# Patient Record
Sex: Female | Born: 1970
Health system: Southern US, Community
[De-identification: ages and names within clinical notes are randomized; demographics above are authoritative.]

## PROBLEM LIST (undated history)

## (undated) DIAGNOSIS — Z87442 Personal history of urinary calculi: Secondary | ICD-10-CM

## (undated) DIAGNOSIS — I10 Essential (primary) hypertension: Secondary | ICD-10-CM

## (undated) DIAGNOSIS — J45909 Unspecified asthma, uncomplicated: Secondary | ICD-10-CM

## (undated) DIAGNOSIS — L309 Dermatitis, unspecified: Secondary | ICD-10-CM

## (undated) DIAGNOSIS — T7840XA Allergy, unspecified, initial encounter: Secondary | ICD-10-CM

## (undated) DIAGNOSIS — N189 Chronic kidney disease, unspecified: Secondary | ICD-10-CM

## (undated) DIAGNOSIS — E119 Type 2 diabetes mellitus without complications: Secondary | ICD-10-CM

## (undated) DIAGNOSIS — R51 Headache: Secondary | ICD-10-CM

## (undated) DIAGNOSIS — Z9889 Other specified postprocedural states: Secondary | ICD-10-CM

## (undated) DIAGNOSIS — R112 Nausea with vomiting, unspecified: Secondary | ICD-10-CM

## (undated) HISTORY — DX: Type 2 diabetes mellitus without complications: E11.9

## (undated) HISTORY — DX: Allergy, unspecified, initial encounter: T78.40XA

## (undated) HISTORY — PX: HERNIA REPAIR: SHX51

## (undated) HISTORY — DX: Unspecified asthma, uncomplicated: J45.909

## (undated) HISTORY — DX: Dermatitis, unspecified: L30.9

## (undated) HISTORY — PX: ABDOMINAL HYSTERECTOMY: SHX81

## (undated) HISTORY — PX: LIPOMA EXCISION: SHX5283

---

## 1998-04-06 ENCOUNTER — Other Ambulatory Visit: Admission: RE | Admit: 1998-04-06 | Discharge: 1998-04-06 | Payer: Self-pay | Admitting: Obstetrics and Gynecology

## 1999-05-17 ENCOUNTER — Other Ambulatory Visit: Admission: RE | Admit: 1999-05-17 | Discharge: 1999-05-17 | Payer: Self-pay | Admitting: *Deleted

## 1999-06-30 ENCOUNTER — Encounter: Payer: Self-pay | Admitting: Obstetrics & Gynecology

## 1999-06-30 ENCOUNTER — Ambulatory Visit (HOSPITAL_COMMUNITY): Admission: RE | Admit: 1999-06-30 | Discharge: 1999-06-30 | Payer: Self-pay | Admitting: Obstetrics & Gynecology

## 1999-12-08 ENCOUNTER — Inpatient Hospital Stay (HOSPITAL_COMMUNITY): Admission: AD | Admit: 1999-12-08 | Discharge: 1999-12-12 | Payer: Self-pay | Admitting: Obstetrics and Gynecology

## 1999-12-08 ENCOUNTER — Encounter (INDEPENDENT_AMBULATORY_CARE_PROVIDER_SITE_OTHER): Payer: Self-pay | Admitting: Specialist

## 2000-09-19 ENCOUNTER — Encounter: Admission: RE | Admit: 2000-09-19 | Discharge: 2000-09-19 | Payer: Self-pay | Admitting: Surgery

## 2000-09-19 ENCOUNTER — Encounter: Payer: Self-pay | Admitting: Surgery

## 2000-09-26 ENCOUNTER — Other Ambulatory Visit: Admission: RE | Admit: 2000-09-26 | Discharge: 2000-09-26 | Payer: Self-pay | Admitting: Obstetrics and Gynecology

## 2000-10-30 ENCOUNTER — Observation Stay (HOSPITAL_COMMUNITY): Admission: RE | Admit: 2000-10-30 | Discharge: 2000-10-31 | Payer: Self-pay | Admitting: Surgery

## 2000-10-30 ENCOUNTER — Encounter: Payer: Self-pay | Admitting: Surgery

## 2000-10-30 ENCOUNTER — Encounter (INDEPENDENT_AMBULATORY_CARE_PROVIDER_SITE_OTHER): Payer: Self-pay | Admitting: Specialist

## 2001-11-07 ENCOUNTER — Other Ambulatory Visit: Admission: RE | Admit: 2001-11-07 | Discharge: 2001-11-07 | Payer: Self-pay | Admitting: Obstetrics and Gynecology

## 2002-11-11 ENCOUNTER — Other Ambulatory Visit: Admission: RE | Admit: 2002-11-11 | Discharge: 2002-11-11 | Payer: Self-pay | Admitting: Obstetrics and Gynecology

## 2003-02-22 HISTORY — PX: ENDOMETRIAL ABLATION: SHX621

## 2003-06-14 ENCOUNTER — Inpatient Hospital Stay (HOSPITAL_COMMUNITY): Admission: AD | Admit: 2003-06-14 | Discharge: 2003-06-14 | Payer: Self-pay | Admitting: Obstetrics and Gynecology

## 2003-10-06 ENCOUNTER — Ambulatory Visit (HOSPITAL_COMMUNITY): Admission: RE | Admit: 2003-10-06 | Discharge: 2003-10-06 | Payer: Self-pay | Admitting: Obstetrics and Gynecology

## 2003-10-06 ENCOUNTER — Ambulatory Visit (HOSPITAL_BASED_OUTPATIENT_CLINIC_OR_DEPARTMENT_OTHER): Admission: RE | Admit: 2003-10-06 | Discharge: 2003-10-06 | Payer: Self-pay | Admitting: Obstetrics and Gynecology

## 2004-05-11 ENCOUNTER — Emergency Department (HOSPITAL_COMMUNITY): Admission: EM | Admit: 2004-05-11 | Discharge: 2004-05-11 | Payer: Self-pay | Admitting: Emergency Medicine

## 2004-06-06 ENCOUNTER — Emergency Department (HOSPITAL_COMMUNITY): Admission: EM | Admit: 2004-06-06 | Discharge: 2004-06-06 | Payer: Self-pay | Admitting: Emergency Medicine

## 2004-11-11 ENCOUNTER — Other Ambulatory Visit: Admission: RE | Admit: 2004-11-11 | Discharge: 2004-11-11 | Payer: Self-pay | Admitting: Obstetrics and Gynecology

## 2005-12-10 ENCOUNTER — Emergency Department (HOSPITAL_COMMUNITY): Admission: EM | Admit: 2005-12-10 | Discharge: 2005-12-10 | Payer: Self-pay | Admitting: Emergency Medicine

## 2005-12-16 ENCOUNTER — Encounter: Admission: RE | Admit: 2005-12-16 | Discharge: 2005-12-16 | Payer: Self-pay | Admitting: General Surgery

## 2006-01-07 ENCOUNTER — Emergency Department (HOSPITAL_COMMUNITY): Admission: EM | Admit: 2006-01-07 | Discharge: 2006-01-07 | Payer: Self-pay | Admitting: Emergency Medicine

## 2007-08-29 ENCOUNTER — Emergency Department (HOSPITAL_COMMUNITY): Admission: EM | Admit: 2007-08-29 | Discharge: 2007-08-29 | Payer: Self-pay | Admitting: Emergency Medicine

## 2007-12-17 ENCOUNTER — Other Ambulatory Visit: Admission: RE | Admit: 2007-12-17 | Discharge: 2007-12-17 | Payer: Self-pay | Admitting: Obstetrics and Gynecology

## 2009-06-11 ENCOUNTER — Emergency Department (HOSPITAL_COMMUNITY): Admission: EM | Admit: 2009-06-11 | Discharge: 2009-06-11 | Payer: Self-pay | Admitting: Emergency Medicine

## 2010-07-09 NOTE — Op Note (Signed)
NAME:  Misty Barnes, Misty Barnes                         ACCOUNT NO.:  [id number]   MEDICAL RECORD NO.:  [id number]                   PATIENT TYPE:  AMB   LOCATION:  [facility]                                 FACILITY:  [facility]   PHYSICIAN:  Cynthia P. Romine, M.D.             DATE OF BIRTH:  Sep 29, 1970   DATE OF PROCEDURE:  10/06/2003  DATE OF DISCHARGE:                                 OPERATIVE REPORT   PREOPERATIVE DIAGNOSIS:  Menorrhagia.   POSTOPERATIVE DIAGNOSIS:  Menorrhagia.   PROCEDURE:  Endometrial ablation using the Hydrothermablator.   SURGEON:  Cynthia P. Romine, M.D.   ANESTHESIA:  General by LMA.   ESTIMATED BLOOD LOSS:  Minimal.   COMPLICATIONS:  None.   DESCRIPTION OF PROCEDURE:  The patient was taken to the operating room and  after the induction of adequate general anesthesia by LMA, she was placed in  the dorsal lithotomy position and prepped and draped in the usual fashion.  The cervix was grasped on its anterior lip with a single-toothed tenaculum,  and the uterus sounded to 8 cm.  The cervix was dilated to a #23 Shawnie Pons.  The  hysteroscope was introduced.  Photographic documentation was taken of the  clean endometrial cavity.  The hysteroscope was withdrawn to just above the  internal os, and endometrial ablation was carried out in the usual fashion  using the Hydrothermablator.  There were no complications. There was no  leaking of fluid.  Following the procedure, photographic documentation was  taken of the endometrial cavity.  The scope was withdrawn, the instruments  removed from the vagina, and the procedure was terminated.  The patient  tolerated it well and went in satisfactory condition to postanesthesia  recovery.                                               Cynthia P. Romine, M.D.    CPR/MEDQ  D:  10/06/2003  T:  10/06/2003  Job:  [id number]

## 2010-07-09 NOTE — Op Note (Signed)
[place]  Patient:    Misty Barnes, Misty Barnes Visit Number: [id number] MRN: [id number]          Service Type: DSU Location: DAY Attending Physician:  Andre Lefort Dictated by:   Sandria Bales. Ezzard Standing, M.D. Proc. Date: 10/30/00 Admit Date:  10/30/2000   CC:         Misty Barnes. Cliffton Asters, M.D.  Arvella Merles, M.D.   Operative Report  DATE OF BIRTH:  05-Dec-1970  PREOPERATIVE DIAGNOSES:  Right groin mass, umbilical hernia.  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  PROCEDURE:  Right groin exploration with removal of mass and excision of right groin mesh.  SURGEON:  Sandria Bales. Ezzard Standing, M.D.  FIRST ASSISTANT:  No first assistant.  ANESTHESIA:  General with an LMA, supervised by Dr. Mack Guise.  COMPLICATIONS:  None.  INDICATION FOR PROCEDURE:  [name] is a 40 year old white female who has developed right groin pain and a palpable right groin mass.  She had a bilateral inguinal hernia repair about 70 in [place].  She has also had two C-sections, one in 1995 and one in 2001, which were otherwise unremarkable.  This mass has only shown up over the last two or three months. CT scan raises a question of myxoma versus endometrioma.  Patient now comes for exploration of the right groin with removal of this mass.  Also, she has an umbilical hernia we plan to repair at the same time.  DESCRIPTION OF PROCEDURE:  Patient was placed in a supine position and given a general anesthesia with LMA.  She was given 1 g of vancomycin at the initiation of procedure.  Her abdomen was prepped with Betadine solution and sterilely draped.  A right groin incision was made with sharp dissection and carried down into this mass.  Actually, I marked this mass in the holding area before she went back; I also ultrasounded it before and marked it and  put pictures in her chart of this mass which measured about 1.6 cm in maximum diameter.  The mass palpated at about 3 to 4 cm.  I started excising it but got into suture material and granulomatous material which clearly appeared to be chronically infected with a sinus tract; I therefore went on and excised the entire mass which was part mesh.  I also removed what looked like some Prolene sutures; these were probably 2-0 Prolene sutures.  She also had at least one Ethibond suture which had a chronic grunge around it and this actually may be the source of this infection, which is right over the pubic bone medially.  I ended up having to excise a tract which was about 8 to 9 cm in length.  I could not say that I left a definite inguinal hernia defect, but was concerned about leaving a hernia.  I then tried to close the fascial defect using interrupted #1 Vicryl sutures.  Again, I was very hesitant to put any kind of mesh in there because of what I thought was an infection.  I did irrigate with antibiotics which included a double-antibiotic solution which include polymyxin and bacitracin.  I infiltrated with about 20 cc of 0.25% Marcaine plain and then closed it with interrupted #1 Vicryl suture and the skin with a 5-0 subcuticular Vicryl sutures, painted with tincture of Benzoin and Steri-Strips.  Because of the chronic infection in the  right groin, I was very hesitant to try to repair her umbilical hernia, particularly since it is not her primary complaint that she came to me, so I then terminated the procedure just after excising this mass. Dictated by:   Sandria Bales. Ezzard Standing, M.D. Attending Physician:  Andre Lefort DD:  10/30/00 TD:  10/30/00 Job: [id number]

## 2010-07-09 NOTE — H&P (Signed)
[facility]  Patient:    JASNOOR, TRUSSELL                        MRN: [id number] Adm. Date:  [id number] Attending:  Leonard Schwartz Dictator:   Vance Gather Duplantis, C.N.M.                         History and Physical  HISTORY OF PRESENT ILLNESS:   Ms. Gonterman is a 40 year old married black female, gravida 3, para 1-0-1-1 who presents at [redacted] weeks gestation complaining of contractions every 4 to 6 minutes throughout the night.  She denies any leaking or vaginal bleeding.  She denies any nausea, vomiting, headache, or visual disturbances.  Her pregnancy has been followed at [facility] by the MD service and has been essentially uncomplicated though at risk for a history of previous low-transverse cesarean section secondary to failure to progress in 1995, and she also has a history of mitral valve prolapse and atopic dermatitis.  Her pregnancy has also been diagnosed as group B strep positive.  OB-GYN HISTORY:               She is a gravida 3, para 1-0-1-1 who had a low-transverse cesarean section in August 1995, delivered a female infant who weighed 8 pounds, 12 ounces at [redacted] weeks gestation after 48-hour labor induction.  She reports that she "did not dilate at all."  In August 2000, she had an elective AB with no complications and she reports having history of condylomata treated in 1995, abnormal Pap treated with a LEEP procedure in 1999.  GENERAL MEDICAL HISTORY:      She is allergic to penicillin.  It gives her swelling.  Fish gives her swelling and latex gives her a rash.  She reports having had the usual childhood diseases.  She reports a history of mitral valve prolapse diagnosed as a child and she does take antibiotics when she goes to the dentist.  She reports history of anemia as a teenager, migraines previously, and multiple allergies, latex, penicillin, oatmeal, fish, ragweed, etc.  She also had bilateral inguinal hernia surgery  in 1987, a D&C in 2000, and a C section in 1995.  FAMILY HISTORY:               Significant for grandparents with heart disease, hypertension, paternal aunt with chronic bronchitis, maternal grandmother with diabetes, mother with MS, and genetic history significant only for father of the babys first cousin with two children with Downs syndrome.  SOCIAL HISTORY:               She is married to [employer] who is involved and supportive  They are of the [employer].  They are both employed.  She is employed at [employer] as a Nurse, adult.  LABORATORY DATA:              Her prenatal labs reveal blood type O positive, antibody screen is negative.  Sickle cell trait is negative.  Syphilis is nonreactive.  Rubella is immune.  Hepatitis B surface antigen is negative. Pap is within normal limits.  One-hour glucola was within normal range. Maternal serum alpha-fetoprotein had elevated Downs syndrome risk, but her amniocentesis was a normal female.  PHYSICAL EXAMINATION:  VITAL SIGNS:                  Stable.  She is afebrile.  HEENT:                        Grossly within normal limits.  HEART:                        Regular rhythm and rate.  CHEST:                        Clear.  BREASTS:                      Soft and nontender.  ABDOMEN:                      Gravid with uterine contractions every three to four minutes.  Her fetal heart rate is reassuring.  PELVIC:                       Exam after ambulating is now 1 to 2 cm, 80%, and vertex out of the pelvis.  Vertex is confirmed by a portable bedside ultrasound.  EXTREMITIES:                  Within normal limits.  She has diffuse eczema over her entire body.  ASSESSMENT:                   1. Intrauterine pregnancy at 41 weeks.                               2. Early labor.                               3. History of previous low transverse cesarean                                  section.                                4. Desires trial of labor.                               5. Multiparity.                               6. Desires bilateral tubal ligation for                                  sterilization.                               7. Group B strep positive.  PLAN:                         Admit to labor and delivery for a consult with Dr. Leonard Schwartz who will follow patient. DD:  12/08/99 TD:  12/08/99 Job: [id number]

## 2010-07-09 NOTE — Op Note (Signed)
[facility]  Patient:    Misty Barnes, Misty Barnes                        MRN: [id number] Proc. Date: 12/09/99 Adm. Date:  [id number] Attending:  Leonard Schwartz                           Operative Report  PREOPERATIVE DIAGNOSES:       1. Term intrauterine pregnancy.                               2. Failure to progress in labor.                               3. Prior cesarean section.                               4. Desires sterilization.                               5. Meconium stained amniotic fluid.  POSTOPERATIVE DIAGNOSES:      1. Term intrauterine pregnancy.                               2. Failure to progress in labor.                               3. Prior cesarean section.                               4. Desires sterilization.                               5. Meconium stained amniotic fluid.  OPERATION:                    Repeat low transverse cesarean section and                               bilateral tubal ligation.  SURGEON:                      Janine Limbo, M.D.  ASSISTANT:                    Mack Guise, C.N.M.  ANESTHESIA:                   Epidural anesthesia.  ESTIMATED BLOOD LOSS:         750 cc.  INDICATIONS:                  Misty Barnes is a 40 year old female, gravida 3, para 1-0-1-1, who presents at [redacted] weeks gestation in early labor. The patients membranes were ruptured and she was noted to have meconium stained amniotic fluid.  An amnio-infusion was started.  Pitocin was given.  The patient never changed her cervix beyond 2 to 3 cm in spite  of greater than four or five hours of 200 MVU.  Options for management were discussed.  The risks and benefits of cesarean section with tubal ligation were reviewed including, but not limited to, anesthetic complications, bleeding, infections, possible damage to the surrounding organs, and possible tubal failure (10 to 15/1000).  FINDINGS:                     A 7 pound 15  ounce female (name unknown) was delivered from an occiput transverse presentation.  The Apgars were 8 at one minute and 9 at five minutes.  The uterus, fallopian tubes, and ovaries were normal.  The infant cried upon delivery, therefore, the cords were not visualized.  DESCRIPTION OF PROCEDURE:     The patient was taken to the operating room where it was determined that the epidural she had had for labor would be adequate for cesarean delivery. The patients abdomen was prepped with multiple layers of Betadine and then sterilely draped. A Foley catheter had previously been placed.  A low transverse incision was made in the abdomen and carried sharply through the subcutaneous tissue, the fascia, and the anterior peritoneum.  An incision was made in the lower uterine segment and extended transversely. The fetal head was delivered with the assistance of a Mityvac vacuum extractor. The mouth and nose were suctioned. The DeLee trap was used. The remainder of the infant was delivered.  The infant was handed to the awaiting pediatric team.  Routine cord blood studies were obtained. The placenta was removed. The uterine cavity was cleaned of amniotic fluid and clotted blood.  The uterine incision was closed using a running locking suture of 2-0 Vicryl.  Hemostasis was adequate.  The paracolic gutters were cleaned of amniotic fluid and clotted blood.  The left fallopian tube was identified and followed to its fimbriated end.  A knuckle of tube was made on the left using a free tie and then a tied suture ligature of 0 plain catgut.  The knuckle of tube thus made was excised.  The cut ends of the fallopian tubes were cauterized.  Hemostasis was adequate.  An identical procedure was carried out on the opposite side. Again, hemostasis was adequate.  The anterior peritoneum and the abdominal musculature were reapproximated in the midline using 2-0 Vicryl.  The subcutaneous tissue, the fascia, and the  abdominal musculature were irrigated.  The fascia was closed using a running suture of 0 Vicryl followed by three interrupted sutures of 0 Vicryl.  The subcutaneous layer was irrigated.  A Jackson-Pratt drain was placed in the subcutaneous layer and then sutured to the left lower quadrant through a separate incision. The tissue seemed to be moist even after achieving hemostasis.  The subcutaneous layer was then closed using a running suture of 2-0 Vicryl.  The skin was reapproximated using skin staples.  The Jackson-Pratt drain was sewn into place using 3-0 silk.  Sponge, needle and instrument counts were correct on two occasions.  The estimated blood loss was 750 cc.  The patient tolerated the procedure well.  The patient was taken to the recovery room in stable condition. The infant was taken to the full-term nursery in stable condition. D:  12/09/99 TD:  12/09/99 Job: [id number]

## 2010-08-23 ENCOUNTER — Inpatient Hospital Stay (HOSPITAL_COMMUNITY)
Admission: AD | Admit: 2010-08-23 | Discharge: 2010-08-23 | Disposition: A | Discharge status: Home / Self Care | Payer: 59 | Source: Ambulatory Visit | Attending: Obstetrics and Gynecology | Admitting: Obstetrics and Gynecology

## 2010-08-23 ENCOUNTER — Inpatient Hospital Stay (HOSPITAL_COMMUNITY): Payer: 59

## 2010-08-23 DIAGNOSIS — N938 Other specified abnormal uterine and vaginal bleeding: Secondary | ICD-10-CM

## 2010-08-23 DIAGNOSIS — N949 Unspecified condition associated with female genital organs and menstrual cycle: Secondary | ICD-10-CM

## 2010-08-23 DIAGNOSIS — D259 Leiomyoma of uterus, unspecified: Secondary | ICD-10-CM | POA: Insufficient documentation

## 2010-08-23 DIAGNOSIS — N925 Other specified irregular menstruation: Secondary | ICD-10-CM

## 2010-08-23 LAB — CBC
HCT: 34.4 % — ABNORMAL LOW (ref 36.0–46.0)
Hemoglobin: 11.4 g/dL — ABNORMAL LOW (ref 12.0–15.0)
MCH: 27.9 pg (ref 26.0–34.0)
Platelets: 242 10*3/uL (ref 150–400)

## 2010-08-23 LAB — POCT PREGNANCY, URINE: Preg Test, Ur: NEGATIVE

## 2010-08-24 LAB — GC/CHLAMYDIA PROBE AMP, GENITAL: Chlamydia, DNA Probe: NEGATIVE

## 2010-11-13 ENCOUNTER — Inpatient Hospital Stay (INDEPENDENT_AMBULATORY_CARE_PROVIDER_SITE_OTHER)
Admission: RE | Admit: 2010-11-13 | Discharge: 2010-11-13 | Disposition: A | Discharge status: Home / Self Care | Payer: 59 | Source: Ambulatory Visit | Attending: Family Medicine | Admitting: Family Medicine

## 2010-11-13 DIAGNOSIS — L02419 Cutaneous abscess of limb, unspecified: Secondary | ICD-10-CM

## 2010-11-15 LAB — CULTURE, ROUTINE-ABSCESS

## 2010-11-18 LAB — URINE MICROSCOPIC-ADD ON

## 2010-11-18 LAB — URINALYSIS, ROUTINE W REFLEX MICROSCOPIC
Bilirubin Urine: NEGATIVE
Glucose, UA: NEGATIVE
Ketones, ur: NEGATIVE
Protein, ur: NEGATIVE
Specific Gravity, Urine: 1.026
Urobilinogen, UA: 0.2
pH: 7

## 2010-12-10 ENCOUNTER — Inpatient Hospital Stay (HOSPITAL_COMMUNITY)
Admission: RE | Admit: 2010-12-10 | Discharge: 2010-12-10 | Disposition: A | Discharge status: Home / Self Care | Payer: 59 | Source: Ambulatory Visit | Attending: Emergency Medicine | Admitting: Emergency Medicine

## 2011-01-07 ENCOUNTER — Encounter (HOSPITAL_COMMUNITY): Payer: Self-pay

## 2011-01-07 ENCOUNTER — Encounter (HOSPITAL_COMMUNITY)
Admission: RE | Admit: 2011-01-07 | Discharge: 2011-01-07 | Disposition: A | Discharge status: Home / Self Care | Payer: 59 | Source: Ambulatory Visit | Attending: Obstetrics and Gynecology | Admitting: Obstetrics and Gynecology

## 2011-01-07 HISTORY — DX: Headache: R51

## 2011-01-07 HISTORY — DX: Nausea with vomiting, unspecified: R11.2

## 2011-01-07 HISTORY — DX: Chronic kidney disease, unspecified: N18.9

## 2011-01-07 HISTORY — DX: Other specified postprocedural states: Z98.890

## 2011-01-07 LAB — COMPREHENSIVE METABOLIC PANEL
ALT: 12 U/L (ref 0–35)
AST: 17 U/L (ref 0–37)
Alkaline Phosphatase: 65 U/L (ref 39–117)
BUN: 13 mg/dL (ref 6–23)
Calcium: 9.6 mg/dL (ref 8.4–10.5)
Chloride: 99 mEq/L (ref 96–112)
Creatinine, Ser: 0.79 mg/dL (ref 0.50–1.10)
GFR calc Af Amer: >90 mL/min (ref >90)
Glucose, Bld: 101 mg/dL — ABNORMAL HIGH (ref 70–99)
Potassium: 3.3 mEq/L — ABNORMAL LOW (ref 3.5–5.1)
Sodium: 134 mEq/L — ABNORMAL LOW (ref 135–145)
Total Bilirubin: 0.2 mg/dL — ABNORMAL LOW (ref 0.3–1.2)
Total Protein: 8.7 g/dL — ABNORMAL HIGH (ref 6.0–8.3)

## 2011-01-07 LAB — CBC
HCT: 35.6 % — ABNORMAL LOW (ref 36.0–46.0)
MCH: 28.4 pg (ref 26.0–34.0)
MCV: 85.8 fL (ref 78.0–100.0)

## 2011-01-07 LAB — SURGICAL PCR SCREEN: MRSA, PCR: POSITIVE — AB

## 2011-01-07 NOTE — Patient Instructions (Addendum)
YOUR PROCEDURE IS SCHEDULED ON: 01/10/11  ENTER THROUGH THE MAIN ENTRANCE OF [facility] AT:6am  USE DESK PHONE AND DIAL [id number] TO INFORM us OF YOUR ARRIVAL  CALL [phone] IF YOU HAVE ANY QUESTIONS OR PROBLEMS PRIOR TO YOUR ARRIVAL.  REMEMBER: DO NOT EAT OR DRINK AFTER MIDNIGHT :Sunday  SPECIAL INSTRUCTIONS:use Hibiclens   YOU MAY BRUSH YOUR TEETH THE MORNING OF SURGERY   TAKE THESE MEDICINES THE DAY OF SURGERY WITH SIP OF WATER: none   DO NOT WEAR JEWELRY, EYE MAKEUP, LIPSTICK OR DARK FINGERNAIL POLISH DO NOT WEAR LOTIONS OR DEODORANT DO NOT SHAVE FOR 48 HOURS PRIOR TO SURGERY  YOU WILL NOT BE ALLOWED TO DRIVE YOURSELF HOME.  NAME OF DRIVER:[place]- [phone] or [phone]

## 2011-01-07 NOTE — H&P (Signed)
Misty Barnes is an 40 y.o. female.   Chief Complain:abnormal vaginal bleeding HPI:40 yo [name] G3P2 with menorrhagia, and a PUS that showed her uterus is 10 x 8 x 7 cm with 9 fibrois, the largest being 3.5 cm.  Endo bx benign.  No past medical history on file.  No past surgical history on file.  No family history on file. Social History:  does not have a smoking history on file. She does not have any smokeless tobacco history on file. Her alcohol and drug histories not on file.  Allergies: Allergies not on file  No current facility-administered medications on file as of .   No current outpatient prescriptions on file as of .    No results found for this or any previous visit (from the past 48 hour(s)). No results found.  Review of Systems  All other systems reviewed and are negative.    There were no vitals taken for this visit. Physical Exam  Constitutional: She is oriented to person, place, and time. She appears well-developed and well-nourished.  HENT:  Head: Normocephalic and atraumatic.  Eyes: Conjunctivae are normal. Pupils are equal, round, and reactive to light.  Neck: Normal range of motion. Neck supple.  Cardiovascular: Normal rate, regular rhythm and normal heart sounds.   Respiratory: Effort normal and breath sounds normal.  GI: Soft. Bowel sounds are normal.  Genitourinary: Vagina normal.       Uterus 10 cm, mobile, NT.  Adnexa neg.  Musculoskeletal: Normal range of motion.  Neurological: She is oriented to person, place, and time.  Skin: Skin is warm and dry.  Psychiatric: She has a normal mood and affect.     Assessment/Plan Menorrhagia, Fibroids, probabe adenomyosis, for robotic TLH.  Misty Barnes P 01/07/2011, 11:11 AM   I have examined the patient and there are no changes.

## 2011-01-07 NOTE — Pre-Procedure Instructions (Signed)
No open wounds currently but pt had MRSA infection rt lower leg in October.

## 2011-01-09 MED ORDER — CIPROFLOXACIN IN D5W 400 MG/200ML IV SOLN
400.0000 mg | INTRAVENOUS | Status: DC
  Filled 2011-01-09: qty 200

## 2011-01-09 MED ORDER — METRONIDAZOLE IN NACL 5-0.79 MG/ML-% IV SOLN
500.0000 mg | INTRAVENOUS | Status: DC
  Filled 2011-01-09: qty 100

## 2011-01-10 ENCOUNTER — Encounter (HOSPITAL_COMMUNITY): Payer: Self-pay | Admitting: Anesthesiology

## 2011-01-10 ENCOUNTER — Other Ambulatory Visit: Payer: Self-pay | Admitting: Obstetrics and Gynecology

## 2011-01-10 ENCOUNTER — Ambulatory Visit (HOSPITAL_COMMUNITY)
Admission: RE | Admit: 2011-01-10 | Discharge: 2011-01-10 | Disposition: A | Discharge status: Home / Self Care | Payer: 59 | Source: Ambulatory Visit | Attending: Obstetrics and Gynecology | Admitting: Obstetrics and Gynecology

## 2011-01-10 ENCOUNTER — Encounter (HOSPITAL_COMMUNITY): Payer: Self-pay | Admitting: *Deleted

## 2011-01-10 ENCOUNTER — Ambulatory Visit (HOSPITAL_COMMUNITY): Payer: 59 | Admitting: Anesthesiology

## 2011-01-10 ENCOUNTER — Encounter (HOSPITAL_COMMUNITY)
Admission: RE | Disposition: A | Discharge status: Home / Self Care | Payer: Self-pay | Source: Ambulatory Visit | Attending: Obstetrics and Gynecology

## 2011-01-10 DIAGNOSIS — D250 Submucous leiomyoma of uterus: Secondary | ICD-10-CM | POA: Insufficient documentation

## 2011-01-10 DIAGNOSIS — Z01818 Encounter for other preprocedural examination: Secondary | ICD-10-CM | POA: Insufficient documentation

## 2011-01-10 DIAGNOSIS — Z01812 Encounter for preprocedural laboratory examination: Secondary | ICD-10-CM | POA: Insufficient documentation

## 2011-01-10 DIAGNOSIS — N8000 Endometriosis of the uterus, unspecified: Secondary | ICD-10-CM | POA: Insufficient documentation

## 2011-01-10 DIAGNOSIS — N920 Excessive and frequent menstruation with regular cycle: Secondary | ICD-10-CM | POA: Insufficient documentation

## 2011-01-10 DIAGNOSIS — D252 Subserosal leiomyoma of uterus: Secondary | ICD-10-CM | POA: Insufficient documentation

## 2011-01-10 DIAGNOSIS — D251 Intramural leiomyoma of uterus: Secondary | ICD-10-CM | POA: Insufficient documentation

## 2011-01-10 HISTORY — PX: CYSTOSCOPY: SHX5120

## 2011-01-10 SURGERY — ROBOTIC ASSISTED TOTAL HYSTERECTOMY
Anesthesia: General | Site: Bladder | Wound class: Clean Contaminated

## 2011-01-10 MED ORDER — GLYCOPYRROLATE 0.2 MG/ML IJ SOLN
INTRAMUSCULAR | Status: DC | PRN
  Administered 2011-01-10: .6 mg via INTRAVENOUS

## 2011-01-10 MED ORDER — METRONIDAZOLE IN NACL 5-0.79 MG/ML-% IV SOLN
INTRAVENOUS | Status: DC | PRN
  Administered 2011-01-10: 500 mg via INTRAVENOUS

## 2011-01-10 MED ORDER — PROPOFOL 10 MG/ML IV EMUL
INTRAVENOUS | Status: DC | PRN
  Administered 2011-01-10 (×2): 20 mg via INTRAVENOUS
  Administered 2011-01-10: 200 mg via INTRAVENOUS
  Administered 2011-01-10: 50 mg via INTRAVENOUS

## 2011-01-10 MED ORDER — SUFENTANIL CITRATE 50 MCG/ML IV SOLN
INTRAVENOUS | Status: AC
  Filled 2011-01-10: qty 1

## 2011-01-10 MED ORDER — ROCURONIUM BROMIDE 50 MG/5ML IV SOLN
INTRAVENOUS | Status: AC
  Filled 2011-01-10: qty 1

## 2011-01-10 MED ORDER — KETOROLAC TROMETHAMINE 30 MG/ML IJ SOLN
INTRAMUSCULAR | Status: AC
  Administered 2011-01-10: 30 mg via INTRAVENOUS
  Filled 2011-01-10: qty 1

## 2011-01-10 MED ORDER — PROPOFOL 10 MG/ML IV EMUL
INTRAVENOUS | Status: AC
  Filled 2011-01-10: qty 40

## 2011-01-10 MED ORDER — ONDANSETRON HCL 4 MG/2ML IJ SOLN
INTRAMUSCULAR | Status: AC
  Filled 2011-01-10: qty 2

## 2011-01-10 MED ORDER — DEXAMETHASONE SODIUM PHOSPHATE 10 MG/ML IJ SOLN
INTRAMUSCULAR | Status: AC
  Filled 2011-01-10: qty 1

## 2011-01-10 MED ORDER — MIDAZOLAM HCL 5 MG/5ML IJ SOLN
INTRAMUSCULAR | Status: DC | PRN
  Administered 2011-01-10: 2 mg via INTRAVENOUS

## 2011-01-10 MED ORDER — OXYCODONE-ACETAMINOPHEN 5-325 MG PO TABS
1.0000 | ORAL_TABLET | ORAL | Status: DC | PRN
  Administered 2011-01-10: 2 via ORAL
  Administered 2011-01-10: 1 via ORAL
  Filled 2011-01-10: qty 1

## 2011-01-10 MED ORDER — CIPROFLOXACIN IN D5W 400 MG/200ML IV SOLN
INTRAVENOUS | Status: DC | PRN
  Administered 2011-01-10: 400 mg via INTRAVENOUS

## 2011-01-10 MED ORDER — HYDROMORPHONE HCL PF 1 MG/ML IJ SOLN
0.2500 mg | INTRAMUSCULAR | Status: DC | PRN
  Administered 2011-01-10 (×2): 0.5 mg via INTRAVENOUS

## 2011-01-10 MED ORDER — GLYCOPYRROLATE 0.2 MG/ML IJ SOLN
INTRAMUSCULAR | Status: AC
  Filled 2011-01-10: qty 2

## 2011-01-10 MED ORDER — ACETAMINOPHEN 10 MG/ML IV SOLN
1000.0000 mg | Freq: Once | INTRAVENOUS | Status: AC
  Administered 2011-01-10: 1000 mg via INTRAVENOUS
  Filled 2011-01-10: qty 100

## 2011-01-10 MED ORDER — LIDOCAINE HCL (CARDIAC) 20 MG/ML IV SOLN
INTRAVENOUS | Status: AC
  Filled 2011-01-10: qty 5

## 2011-01-10 MED ORDER — FENTANYL CITRATE 0.05 MG/ML IJ SOLN
INTRAMUSCULAR | Status: AC
  Administered 2011-01-10: 50 ug via INTRAVENOUS
  Filled 2011-01-10: qty 2

## 2011-01-10 MED ORDER — PROMETHAZINE HCL 25 MG/ML IJ SOLN
12.5000 mg | INTRAMUSCULAR | Status: DC | PRN
  Administered 2011-01-10: 12.5 mg via INTRAVENOUS
  Filled 2011-01-10: qty 1

## 2011-01-10 MED ORDER — SUFENTANIL CITRATE 50 MCG/ML IV SOLN
INTRAVENOUS | Status: DC | PRN
  Administered 2011-01-10: 10 ug via INTRAVENOUS
  Administered 2011-01-10: 5 ug via INTRAVENOUS
  Administered 2011-01-10: 15 ug via INTRAVENOUS
  Administered 2011-01-10 (×2): 10 ug via INTRAVENOUS

## 2011-01-10 MED ORDER — FENTANYL CITRATE 0.05 MG/ML IJ SOLN
25.0000 ug | INTRAMUSCULAR | Status: DC | PRN
  Administered 2011-01-10 (×2): 50 ug via INTRAVENOUS

## 2011-01-10 MED ORDER — ROPIVACAINE HCL 5 MG/ML IJ SOLN
INTRAMUSCULAR | Status: DC | PRN
  Administered 2011-01-10: 120 mL via EPIDURAL

## 2011-01-10 MED ORDER — DEXTROSE IN LACTATED RINGERS 5 % IV SOLN
INTRAVENOUS | Status: DC
  Administered 2011-01-10: 15:00:00 via INTRAVENOUS

## 2011-01-10 MED ORDER — STERILE WATER FOR IRRIGATION IR SOLN
Status: DC | PRN
  Administered 2011-01-10: 1000 mL

## 2011-01-10 MED ORDER — HYDROMORPHONE HCL PF 1 MG/ML IJ SOLN
INTRAMUSCULAR | Status: AC
  Administered 2011-01-10: 0.5 mg via INTRAVENOUS
  Filled 2011-01-10: qty 1

## 2011-01-10 MED ORDER — ONDANSETRON HCL 4 MG/2ML IJ SOLN
INTRAMUSCULAR | Status: DC | PRN
  Administered 2011-01-10 (×2): 4 mg via INTRAVENOUS

## 2011-01-10 MED ORDER — OXYCODONE-ACETAMINOPHEN 5-325 MG PO TABS
ORAL_TABLET | ORAL | Status: AC
  Administered 2011-01-10: 2 via ORAL
  Filled 2011-01-10: qty 2

## 2011-01-10 MED ORDER — ROCURONIUM BROMIDE 100 MG/10ML IV SOLN
INTRAVENOUS | Status: DC | PRN
  Administered 2011-01-10: 10 mg via INTRAVENOUS
  Administered 2011-01-10: 50 mg via INTRAVENOUS
  Administered 2011-01-10: 10 mg via INTRAVENOUS

## 2011-01-10 MED ORDER — NEOSTIGMINE METHYLSULFATE 1 MG/ML IJ SOLN
INTRAMUSCULAR | Status: AC
  Filled 2011-01-10: qty 10

## 2011-01-10 MED ORDER — DEXAMETHASONE SODIUM PHOSPHATE 10 MG/ML IJ SOLN
INTRAMUSCULAR | Status: DC | PRN
  Administered 2011-01-10: 10 mg via INTRAVENOUS

## 2011-01-10 MED ORDER — KETOROLAC TROMETHAMINE 30 MG/ML IJ SOLN
15.0000 mg | Freq: Once | INTRAMUSCULAR | Status: AC | PRN
  Administered 2011-01-10: 30 mg via INTRAVENOUS

## 2011-01-10 MED ORDER — LACTATED RINGERS IV SOLN
INTRAVENOUS | Status: DC
  Administered 2011-01-10 (×3): via INTRAVENOUS
  Administered 2011-01-10: 1000 mL via INTRAVENOUS

## 2011-01-10 MED ORDER — NEOSTIGMINE METHYLSULFATE 1 MG/ML IJ SOLN
INTRAMUSCULAR | Status: DC | PRN
  Administered 2011-01-10: 3 mg via INTRAVENOUS

## 2011-01-10 MED ORDER — MIDAZOLAM HCL 2 MG/2ML IJ SOLN
INTRAMUSCULAR | Status: AC
  Filled 2011-01-10: qty 2

## 2011-01-10 MED ORDER — LACTATED RINGERS IR SOLN
Status: DC | PRN
  Administered 2011-01-10: 3000 mL

## 2011-01-10 MED ORDER — LIDOCAINE HCL (CARDIAC) 20 MG/ML IV SOLN
INTRAVENOUS | Status: DC | PRN
  Administered 2011-01-10: 50 mg via INTRAVENOUS

## 2011-01-10 SURGICAL SUPPLY — 74 items
ADH SKN CLS APL DERMABOND .7 (GAUZE/BANDAGES/DRESSINGS) ×8
APL SKNCLS STERI-STRIP NONHPOA (GAUZE/BANDAGES/DRESSINGS) ×2
BAG URINE DRAINAGE (UROLOGICAL SUPPLIES) ×3 IMPLANT
BARRIER ADHS 3X4 INTERCEED (GAUZE/BANDAGES/DRESSINGS) ×3 IMPLANT
BENZOIN TINCTURE PRP APPL 2/3 (GAUZE/BANDAGES/DRESSINGS) ×3 IMPLANT
BLADELESS LONG 8MM (BLADE) IMPLANT
BRR ADH 4X3 ABS CNTRL BYND (GAUZE/BANDAGES/DRESSINGS) ×2
CABLE HIGH FREQUENCY MONO STRZ (ELECTRODE) ×3 IMPLANT
CATH FOLEY 3WAY  5CC 16FR (CATHETERS) ×1
CATH FOLEY 3WAY 5CC 16FR (CATHETERS) ×2 IMPLANT
CONT PATH 16OZ SNAP LID 3702 (MISCELLANEOUS) ×3 IMPLANT
COVER MAYO STAND STRL (DRAPES) ×3 IMPLANT
COVER TABLE BACK 60X90 (DRAPES) ×6 IMPLANT
COVER TIP SHEARS 8 DVNC (MISCELLANEOUS) ×2 IMPLANT
COVER TIP SHEARS 8MM DA VINCI (MISCELLANEOUS) ×2
DECANTER SPIKE VIAL GLASS SM (MISCELLANEOUS) ×4 IMPLANT
DERMABOND ADVANCED (GAUZE/BANDAGES/DRESSINGS) ×4
DERMABOND ADVANCED .7 DNX12 (GAUZE/BANDAGES/DRESSINGS) IMPLANT
DRAPE HUG U DISPOSABLE (DRAPE) ×3 IMPLANT
DRAPE LG THREE QUARTER DISP (DRAPES) ×5 IMPLANT
DRAPE MONITOR DA VINCI (DRAPE) ×3 IMPLANT
DRAPE WARM FLUID 44X44 (DRAPE) ×3 IMPLANT
ELECT REM PT RETURN 9FT ADLT (ELECTROSURGICAL) ×3
ELECTRODE REM PT RTRN 9FT ADLT (ELECTROSURGICAL) ×2 IMPLANT
EVACUATOR SMOKE 8.L (FILTER) ×3 IMPLANT
GAUZE VASELINE 3X9 (GAUZE/BANDAGES/DRESSINGS) ×1 IMPLANT
GLOVE BIO SURGEON STRL SZ 6.5 (GLOVE) ×6 IMPLANT
GLOVE BIOGEL PI IND STRL 7.0 (GLOVE) ×8 IMPLANT
GLOVE BIOGEL PI INDICATOR 7.0 (GLOVE) ×4
GLOVE ECLIPSE 6.5 STRL STRAW (GLOVE) ×8 IMPLANT
GLOVE SKINSENSE NS SZ6.5 (GLOVE) ×6
GLOVE SKINSENSE STRL SZ6.5 (GLOVE) IMPLANT
GOWN PREVENTION PLUS LG XLONG (DISPOSABLE) ×21 IMPLANT
GYRUS RUMI II 2.5CM BLUE (DISPOSABLE)
GYRUS RUMI II 3.5CM BLUE (DISPOSABLE)
GYRUS RUMI II 4.0CM BLUE (DISPOSABLE)
KIT DISP ACCESSORY 4 ARM (KITS) ×3 IMPLANT
NDL INSUFFLATION 14GA 120MM (NEEDLE) ×2 IMPLANT
NEEDLE INSUFFLATION 14GA 120MM (NEEDLE) ×3 IMPLANT
NS IRRIG 1000ML POUR BTL (IV SOLUTION) ×6 IMPLANT
OCCLUDER COLPOPNEUMO (BALLOONS) ×3 IMPLANT
PACK LAVH (CUSTOM PROCEDURE TRAY) ×3 IMPLANT
PAD PREP 24X48 CUFFED NSTRL (MISCELLANEOUS) ×6 IMPLANT
PLUG CATH AND CAP STER (CATHETERS) ×3 IMPLANT
RUMI II 3.0CM BLUE KOH-EFFICIE (DISPOSABLE) IMPLANT
RUMI II GYRUS 2.5CM BLUE (DISPOSABLE) IMPLANT
RUMI II GYRUS 3.5CM BLUE (DISPOSABLE) IMPLANT
RUMI II GYRUS 4.0CM BLUE (DISPOSABLE) IMPLANT
SET CYSTO W/LG BORE CLAMP LF (SET/KITS/TRAYS/PACK) ×3 IMPLANT
SET IRRIG TUBING LAPAROSCOPIC (IRRIGATION / IRRIGATOR) ×3 IMPLANT
SOLUTION ELECTROLUBE (MISCELLANEOUS) ×3 IMPLANT
SPONGE LAP 18X18 X RAY DECT (DISPOSABLE) IMPLANT
STRIP CLOSURE SKIN 1/4X4 (GAUZE/BANDAGES/DRESSINGS) ×3 IMPLANT
SUT VIC AB 0 CT1 27 (SUTURE) ×24
SUT VIC AB 0 CT1 27XBRD ANBCTR (SUTURE) ×2 IMPLANT
SUT VIC AB 0 CT1 27XBRD ANTBC (SUTURE) ×10 IMPLANT
SUT VICRYL 0 UR6 27IN ABS (SUTURE) ×3 IMPLANT
SUT VICRYL RAPIDE 4/0 PS 2 (SUTURE) ×6 IMPLANT
SYR 30ML LL (SYRINGE) ×3 IMPLANT
SYR 50ML LL SCALE MARK (SYRINGE) ×4 IMPLANT
SYSTEM CONVERTIBLE TROCAR (TROCAR) IMPLANT
TIP UTERINE 5.1X6CM LAV DISP (MISCELLANEOUS) IMPLANT
TIP UTERINE 6.7X10CM GRN DISP (MISCELLANEOUS) ×1 IMPLANT
TIP UTERINE 6.7X6CM WHT DISP (MISCELLANEOUS) IMPLANT
TIP UTERINE 6.7X8CM BLUE DISP (MISCELLANEOUS) IMPLANT
TOWEL OR 17X24 6PK STRL BLUE (TOWEL DISPOSABLE) ×9 IMPLANT
TROCAR 12M 150ML BLUNT (TROCAR) ×2 IMPLANT
TROCAR DISP BLADELESS 8 DVNC (TROCAR) ×2 IMPLANT
TROCAR DISP BLADELESS 8MM (TROCAR) ×1
TROCAR XCEL 12X100 BLDLESS (ENDOMECHANICALS) ×3 IMPLANT
TUBING FILTER THERMOFLATOR (ELECTROSURGICAL) ×3 IMPLANT
WARMER LAPAROSCOPE (MISCELLANEOUS) ×3 IMPLANT
WATER STERILE IRR 1000ML POUR (IV SOLUTION) ×3 IMPLANT
WATER STERILE IRR 1000ML UROMA (IV SOLUTION) ×1 IMPLANT

## 2011-01-10 NOTE — Anesthesia Postprocedure Evaluation (Signed)
  Anesthesia Post-op Note  Patient: Misty Barnes  Procedure(s) Performed:  ROBOTIC ASSISTED TOTAL HYSTERECTOMY; CYSTOSCOPY  Patient Location: [facility] Unit  Anesthesia Type: General  Level of Consciousness: awake  Airway and Oxygen Therapy: Patient Spontanous Breathing  Post-op Pain: mild  Post-op Assessment: Post-op Vital signs reviewed  Post-op Vital Signs: Reviewed and stable  Complications: No apparent anesthesia complications

## 2011-01-10 NOTE — Anesthesia Postprocedure Evaluation (Signed)
Anesthesia Post Note  Patient: Misty Barnes  Procedure(s) Performed:  ROBOTIC ASSISTED TOTAL HYSTERECTOMY; CYSTOSCOPY  Anesthesia type: General  Patient location: PACU  Post pain: Pain level controlled  Post assessment: Post-op Vital signs reviewed  Last Vitals:  Filed Vitals:   01/10/11 1215  BP: 103/62  Pulse: 70  Temp:   Resp: 16    Post vital signs: Reviewed  Level of consciousness: sedated  Complications: No apparent anesthesia complications

## 2011-01-10 NOTE — Transfer of Care (Signed)
Immediate Anesthesia Transfer of Care Note  Patient: Misty Barnes  Procedure(s) Performed:  ROBOTIC ASSISTED TOTAL HYSTERECTOMY; CYSTOSCOPY  Patient Location: PACU  Anesthesia Type: General  Level of Consciousness: oriented and sedated  Airway & Oxygen Therapy: Patient Spontanous Breathing and Patient connected to nasal cannula oxygen  Post-op Assessment: Report given to PACU RN and Post -op Vital signs reviewed and stable  Post vital signs: stable  Complications: No apparent anesthesia complications

## 2011-01-10 NOTE — Progress Notes (Signed)
Pt requested information on MRSA.  Provided pt with paperwork and pt education on MRSA.

## 2011-01-10 NOTE — Addendum Note (Signed)
Addendum  created 01/10/11 1605 by Cephus Shelling   Modules edited:Notes Section

## 2011-01-10 NOTE — Op Note (Signed)
Preoperative diagnosis: Menorrhagia and uterine fibroids Postoperative diagnosis: Same path pending Procedure robotic assisted total laparoscopic hysterectomy Surgeon Dr. Aram Beecham Romine Assistant Dr. Leda Quail Anesthesia Gen. endotracheal Estimated blood loss 50 cc Complications none Procedure: The patient was taken to the operating room and after induction of adequate general endotracheal anesthesia was placed in the dorsolithotomy position and prepped and draped in usual fashion.  The patient was wrapped with the beanbag to prevent her slipping on the operating room table.  A posterior weighted and anterior Deaver retractor were placed and the cervix was grasped on its anterior lip with a single-tooth tenaculum.  The uterus sounded to 11 cm.  A 10 cm Rumi manipulator and a small Koh ring were placed without difficulty.  The bladder was drained with a Foley catheter.  Meanwhile Dr. Hyacinth Meeker had created a pneumoperitoneum by making a tiny nick in the umbilicus and inserting the peritoneum inserting a varies needle into the peritoneal space.  Pneumoperitoneum was created with 2 L of CO2.  Her infiltrating the skin with ropivacaine that had been diluted one-to-one the skin was incised with a knife and a 5 mm trocar was inserted.  The laparoscope was inserted through the sleeve to document proper placement.  The abdominal wall was transilluminated and the 2 robotic ports were inserted under direct visualization, after infiltrating the skin with a dilute ropivacaine and incised in the skin with a knife.  The ropivacaine was diluted 60 cc of half percent ropivacaine +60 cc of sterile saline.  A small incision was made just in the superior crease of the umbilicus again after infiltrating with ropivacaine, and the bladed 12 mm trocar was inserted and the peritoneal space under direct visualization.  The 5 mm scope was then withdrawn.  60 cc of the quarter percent ropivacaine were then instilled into the pelvis.   The robot was brought in and talked a monopolar scissor was used to port 1 and the PK gyrus was used through port 2 and these were inserted into the pelvis under direct visualization.  The surgeon then proceeded to the consult.  Inspection of the pelvis revealed there to be an adhesion between some of omentum and the anterior abdominal wall in the lower pelvis in the midline.  This was taken down with cautery without difficulty.  The uterus was enlarged approximately 12 weeks size and multinodular.  The largest fibroid seem to the arising from the fundus and was approximately 4 cm.  There was a large nodule in the corpus of the uterus that was very soft and more consistent in its consistency with an adenomyoma than a fibroid.  There was an approximately 3 cm smooth cyst on the right ovary.  Both ovaries were freely mobile.  Both tubes showed evidence of previous tubal sterilization procedure.  The cyst on the right ovary was drained by pouring a small hole in the cyst with the monopolar scissors and allowing it to drain.  Clear fluid drained from the cyst.  The ureters were identified bilaterally and seen peristalsing.  The procedure began on the patient's right identify in the utero-ovarian ligament cauterizing at the PK gyrus and cutting cautery continued across the mesosalpinx ensuring removal of the proximal end of the 2.  The round ligament was cauterized and then cut.  The anterior and posterior leaves of the broad ligament were taken down sharply.  The bladder was dissected off the cervix using sharp dissection and cautery.  The uterine artery on the patient's right was  identified.  It was cauterized multiple times and then cut.  The procedure then moved to the patient's left side the utero-ovarian ligament was identified cauterized and cut again cautery continued across the mesosalpinx to ensure removal of the proximal portion of the left tube.  The round ligament was cauterized and cut.  The anterior  posterior leafs of the broad ligament were taken down sharply.  Further dissection of the bladder was done to free off the cervix.  The right uterine artery was identified.  It was cauterized multiple times and then cut.  Because the patient has never delivered an infant through the vagina and her vagina was felt to be quite narrow it was felt that it would be beneficial to try and remove one or 2 of the fibroids from the uterus before trying to remove it through the vagina.  The large nodule in the fundus was incised with monopolar cautery.  However it was so soft in consistency that it was felt that there was no distinct fibroid that could be excised and that perhaps the uterus would compress on its own as it was removed through the vagina.  Indeed the uterus was able to be removed through the vagina intact.  Uterine weight in the operating room was 311.1 g.  The robotic instruments were exchanged for a suture driver in port 1 and a Cobra grasper in port 2.  A 9 inch VLock suture was dropped through the camera port and was used to close the vagina.  Excellent hemostasis was noted.  The pelvis was irrigated, and was felt to be hemostatic.  A sheet of Interceed was placed over the vaginal cuff.  An additional 60 cc of the dilute ropivacaine solution was dripped into the pelvis.  The robotic instruments were then removed from the abdomen.  The robot was undocked and taken away.  The robotic scope was replaced again with a 5 mm scope, and the needle from the  V LOC that had been tacked to the lateral pelvic sidewall was removed via straight laparoscopy without difficulty.  Robotic ports were removed under direct visualization.  The scope was withdrawn the 5 mm assistant's port was removed.  Pneumoperitoneum was allowed to escape through the umbilical trocar sleeve.  3 manual deep breaths were given to the patient by the nurse anesthetist while the surgeon pressed on the abdomen to ensure the releasing CO2 from the  abdomen.  The umbilical sleeve was removed.  The fascia at the umbilicus was closed with figure-of-eight suture of 0 Vicryl.  The 2 robotic ports were closed with 3-0 Vicryl Rapide and then Dermabond.  The dermabond was placed over the umbilicus incision which was not closed other than with the fascial suture because the skin reapproxomated again so beautifully on its own.  A 5 mm left upper quadrant port was closed with Dermabond.  The surgeon then proceeded to cystoscopy. The cystoscope was inserted into the urethra after the Foley catheter had been removed.  Indigo carmine had not been injected however urine efflux from the ureteral orifices could be clearly seen.  There was no injury to the bladder.  The scope was withdrawn.  A Graves speculum was inserted into the vagina.  Vagina was inspected and found to be intact.  Approximately 20 cc of the dilute ropivacaine solution were injected into the vaginal cuff for postop pain relief.  Instruments were removed from the vagina, and the procedure was terminated.  The patient tolerated it well and when  satisfactory condition to post anesthesia recovery.  Sponge needle instrument counts were correct.

## 2011-01-10 NOTE — Anesthesia Preprocedure Evaluation (Signed)
Anesthesia Evaluation  Patient identified by MRN, date of birth, ID band Patient awake    Reviewed: Allergy & Precautions, H&P , NPO status , Patient's Chart, lab work & pertinent test results, reviewed documented beta blocker date and time   History of Anesthesia Complications (+) PONV  Airway Mallampati: III TM Distance: >3 FB Neck ROM: full    Dental  (+) Teeth Intact   Pulmonary neg pulmonary ROS,  clear to auscultation  Pulmonary exam normal       Cardiovascular Exercise Tolerance: Good neg cardio ROS regular Normal    Neuro/Psych  Headaches (migraines - currently has a migraine), Negative Psych ROS   GI/Hepatic negative GI ROS, Neg liver ROS,   Endo/Other  Morbid obesity  Renal/GU negative Renal ROS  Genitourinary negative   Musculoskeletal   Abdominal   Peds  Hematology negative hematology ROS (+)   Anesthesia Other Findings   Reproductive/Obstetrics negative OB ROS                           Anesthesia Physical Anesthesia Plan  ASA: II  Anesthesia Plan: General   Post-op Pain Management: MAC Combined w/ Regional for Post-op pain   Induction: Intravenous  Airway Management Planned: Oral ETT  Additional Equipment:   Intra-op Plan:   Post-operative Plan: Extubation in OR  Informed Consent: I have reviewed the patients History and Physical, chart, labs and discussed the procedure including the risks, benefits and alternatives for the proposed anesthesia with the patient or authorized representative who has indicated his/her understanding and acceptance.   Dental Advisory Given  Plan Discussed with: CRNA and Surgeon  Anesthesia Plan Comments: (Discussed ultrasound-guided TAP block in PACU for post-op pain.  Patient understands and wishes to proceed.)        Anesthesia Quick Evaluation

## 2011-01-11 ENCOUNTER — Encounter (HOSPITAL_COMMUNITY): Payer: Self-pay | Admitting: Obstetrics and Gynecology

## 2011-03-20 ENCOUNTER — Emergency Department (HOSPITAL_COMMUNITY)
Admission: EM | Admit: 2011-03-20 | Discharge: 2011-03-20 | Disposition: A | Discharge status: Home / Self Care | Payer: 59 | Source: Home / Self Care | Attending: Emergency Medicine | Admitting: Emergency Medicine

## 2011-03-20 ENCOUNTER — Encounter (HOSPITAL_COMMUNITY): Payer: Self-pay

## 2011-03-20 DIAGNOSIS — B302 Viral pharyngoconjunctivitis: Secondary | ICD-10-CM

## 2011-03-20 HISTORY — DX: Essential (primary) hypertension: I10

## 2011-03-20 MED ORDER — IBUPROFEN 600 MG PO TABS: 600.0000 mg | ORAL_TABLET | Freq: Four times a day (QID) | ORAL | Status: DC | PRN

## 2011-03-20 MED ORDER — LIDOCAINE VISCOUS 2 % MT SOLN: 10.0000 mL | OROMUCOSAL | Status: AC | PRN

## 2011-03-20 MED ORDER — HYDROCODONE-ACETAMINOPHEN 5-325 MG PO TABS: 2.0000 | ORAL_TABLET | ORAL | Status: AC | PRN

## 2011-03-20 MED ORDER — PSEUDOEPHEDRINE-GUAIFENESIN ER 120-1200 MG PO TB12: 1.0000 | ORAL_TABLET | Freq: Two times a day (BID) | ORAL | Status: DC | PRN

## 2011-03-20 MED ORDER — TETRACAINE HCL 0.5 % OP SOLN
OPHTHALMIC | Status: AC
  Filled 2011-03-20: qty 2

## 2011-03-20 MED ORDER — POLYETHYL GLYCOL-PROPYL GLYCOL 0.4-0.3 % OP SOLN: 1.0000 [drp] | Freq: Four times a day (QID) | OPHTHALMIC | Status: DC | PRN

## 2011-03-20 NOTE — ED Notes (Signed)
Pt has fever, earache, sorethroat and eyes red and irritated for one week.

## 2011-03-20 NOTE — ED Provider Notes (Signed)
History     CSN: [id number]  Arrival date & time 03/20/11  1457   First MD Initiated Contact with Patient 03/20/11 1500      Chief Complaint  Patient presents with  . URI    (Consider location/radiation/quality/duration/timing/severity/associated sxs/prior treatment) HPI Comments: Patient with  low-grade fevers Tmax 100.5, rhinorrhea, nasal congestion, sore throat, body aches, ear fullness/ear pain x1 week. Reports bilateral conjunctivitis with exudates starting today. States eyes feel "gritty". Mild photophobia. No headache, visual changes, facial rash. States that multiple coworkers with similar symptoms. Has been taking over-the-counter medications without relief. Patient does not wear glasses or contacts.  ROS as noted in HPI. All other ROS negative.   Patient is a 41 y.o. female presenting with URI. The history is provided by the patient. No language interpreter was used.  URI The primary symptoms include fever. The current episode started 6 to 7 days ago.  The onset of the illness is associated with exposure to sick contacts. Symptoms associated with the illness include congestion and rhinorrhea.    Past Medical History  Diagnosis Date  . PONV (postoperative nausea and vomiting)   . Headache     migraines  . Chronic kidney disease     stones    Past Surgical History  Procedure Date  . Endometrial ablation 2005  . Lipoma excision     cs  . Cesarean section     x 2  . Hernia repair   . Cystoscopy 01/10/2011    Procedure: CYSTOSCOPY;  Surgeon: Edwena Felty Romine;  Location: [facility];  Service: Gynecology;  Laterality: N/A;  . Kidney stone surgery   . Abdominal hysterectomy     History reviewed. No pertinent family history.  History  Substance Use Topics  . Smoking status: Never Smoker   . Smokeless tobacco: Not on file  . Alcohol Use: No    OB History    Grav Para Term Preterm Abortions TAB SAB Ect Mult Living                  Review of Systems    Constitutional: Positive for fever.  HENT: Positive for congestion and rhinorrhea.     Allergies  Penicillins  Home Medications   Current Outpatient Rx  Name Route Sig Dispense Refill  . VITAMIN D (ERGOCALCIFEROL) 50000 UNITS PO CAPS Oral Take 50,000 Units by mouth.    Marland Kitchen ZONISAMIDE 25 MG PO CAPS Oral Take 25 mg by mouth daily.    . ACETAMINOPHEN 500 MG PO TABS Oral Take 500 mg by mouth every 6 (six) hours as needed.      . DULOXETINE HCL 60 MG PO CPEP Oral Take 60 mg by mouth daily.      Marland Kitchen HYDROCODONE-ACETAMINOPHEN 5-325 MG PO TABS Oral Take 2 tablets by mouth every 4 (four) hours as needed for pain. 20 tablet 0  . HYDROXYZINE HCL 25 MG PO TABS Oral Take 25 mg by mouth daily as needed. For itching     . IBUPROFEN 600 MG PO TABS Oral Take 1 tablet (600 mg total) by mouth every 6 (six) hours as needed for pain. 30 tablet 0  . LIDOCAINE VISCOUS 2 % MT SOLN Oral Take 10 mLs by mouth as needed for pain. Swish and spit. Do not swallow. 100 mL 0  . METAXALONE 800 MG PO TABS Oral Take 800 mg by mouth daily as needed. For headache pain     . POLYETHYL GLYCOL-PROPYL GLYCOL 0.4-0.3 % OP SOLN Ophthalmic  Apply 1 drop to eye 4 (four) times daily as needed. 5 mL 0  . PSEUDOEPHEDRINE-GUAIFENESIN ER [phone] MG PO TB12 Oral Take 1 tablet by mouth 2 (two) times daily as needed (congestion). 20 each 0    BP 109/79  Pulse 86  Temp(Src) 98.6 F (37 C) (Oral)  Resp 20  SpO2 99%  LMP 11/07/2010  Physical Exam  Nursing note and vitals reviewed. Constitutional: She is oriented to person, place, and time. She appears well-developed and well-nourished.  HENT:  Head: Normocephalic and atraumatic.  Right Ear: Tympanic membrane and ear canal normal.  Left Ear: Tympanic membrane and ear canal normal.  Nose: Mucosal edema and rhinorrhea present. No epistaxis.  Mouth/Throat: Uvula is midline and mucous membranes are normal. Posterior oropharyngeal erythema present. No oropharyngeal exudate.       Enlarged,  erythematous tonsils. (-) frontal, maxillary sinus tenderness  Eyes: EOM are normal. Pupils are equal, round, and reactive to light. No foreign bodies found. Right eye exhibits discharge. Left eye exhibits discharge. Right conjunctiva is injected. Left conjunctiva is injected.  Slit lamp exam:      The right eye shows fluorescein uptake.       The left eye shows fluorescein uptake.       Visual acuity 20/25 bilaterally. Punctate lesions on bilateral corneas on slit-lamp flourescin exam.  Neck: Normal range of motion. Neck supple.  Cardiovascular: Normal rate, regular rhythm, normal heart sounds and intact distal pulses.   Pulmonary/Chest: Effort normal and breath sounds normal. She has no rales.  Abdominal: Soft. Bowel sounds are normal. There is no tenderness.  Musculoskeletal: Normal range of motion.  Lymphadenopathy:    She has cervical adenopathy.  Neurological: She is alert and oriented to person, place, and time.  Skin: Skin is warm and dry. No rash noted.  Psychiatric: She has a normal mood and affect. Her behavior is normal. Judgment and thought content normal.    ED Course  Procedures (including critical care time)  Labs Reviewed - No data to display No results found.   1. Pharyngoconjunctival fever       MDM    Luiz Blare, MD 03/20/11 2201

## 2011-04-11 ENCOUNTER — Encounter: Payer: 59 | Admitting: Physical Medicine & Rehabilitation

## 2011-05-10 ENCOUNTER — Encounter: Payer: 59 | Attending: Physical Medicine & Rehabilitation | Admitting: Physical Medicine & Rehabilitation

## 2011-05-10 ENCOUNTER — Encounter: Payer: Self-pay | Admitting: Physical Medicine & Rehabilitation

## 2011-05-10 ENCOUNTER — Ambulatory Visit (HOSPITAL_COMMUNITY)
Admission: RE | Admit: 2011-05-10 | Discharge: 2011-05-10 | Disposition: A | Discharge status: Home / Self Care | Payer: 59 | Source: Ambulatory Visit | Attending: Physical Medicine & Rehabilitation | Admitting: Physical Medicine & Rehabilitation

## 2011-05-10 VITALS — BP 131/73 | HR 103 | Resp 18 | Ht 65.5 in | Wt 239.0 lb

## 2011-05-10 DIAGNOSIS — E559 Vitamin D deficiency, unspecified: Secondary | ICD-10-CM | POA: Insufficient documentation

## 2011-05-10 DIAGNOSIS — G43119 Migraine with aura, intractable, without status migrainosus: Secondary | ICD-10-CM

## 2011-05-10 DIAGNOSIS — M5030 Other cervical disc degeneration, unspecified cervical region: Secondary | ICD-10-CM | POA: Insufficient documentation

## 2011-05-10 DIAGNOSIS — E669 Obesity, unspecified: Secondary | ICD-10-CM | POA: Insufficient documentation

## 2011-05-10 DIAGNOSIS — M7918 Myalgia, other site: Secondary | ICD-10-CM

## 2011-05-10 DIAGNOSIS — G43909 Migraine, unspecified, not intractable, without status migrainosus: Secondary | ICD-10-CM | POA: Insufficient documentation

## 2011-05-10 DIAGNOSIS — M542 Cervicalgia: Secondary | ICD-10-CM

## 2011-05-10 DIAGNOSIS — IMO0001 Reserved for inherently not codable concepts without codable children: Secondary | ICD-10-CM

## 2011-05-10 MED ORDER — RIZATRIPTAN BENZOATE 5 MG PO TABS: 5.0000 mg | ORAL_TABLET | ORAL | Status: DC | PRN

## 2011-05-10 MED ORDER — GABAPENTIN 100 MG PO CAPS: 100.0000 mg | ORAL_CAPSULE | Freq: Three times a day (TID) | ORAL | Status: DC

## 2011-05-10 MED ORDER — MELOXICAM 7.5 MG PO TABS: 7.5000 mg | ORAL_TABLET | Freq: Every day | ORAL | Status: AC

## 2011-05-10 NOTE — Progress Notes (Signed)
Subjective:    Patient ID: Misty Barnes, female    DOB: 1970/08/08, 41 y.o.   MRN: [id number]  HPI 36 you african [place] female well known to me who comes into today with signficant headaches.  She recalls having onset of headaches after a fall where she hit her head/neck and was unconsicous for a couple minutes. Since then the headaches have been present originating from the left neck and radiating over the left temporal region into the eye.   The headaches are worst in the am when she awakens.  They are usually a grade 5 initially, but they will intensify during the day the more she's up.  They sometimes get better with rest or ice. Hyrdocodone and flexeril help with severe pain when it happens. She has attempted weight loss to help with symptoms.  She walks regularly.  Warm compresses occasionally help. Caffeine avoidance has helped but she still needs caffeine at night to work her third shift.   She reports a feeling of the left neck being "swollen". She reports light-sensitivity.  She sees "round" spots on the left side when the headache starts to get worse.  She occasional will have the visual aura prior the headache sx.  She has seen Dr. Vela Prose for the last 2 years. She last saw him about 2months ago.  She was placed on zonegran qhs without results so far.  She has had trigger point injections in head and neck which would help for a month or so. No botox injections were done. She was also treated by [facility] for symptoms.    Misty Barnes reports a period of 3 weeks where her hearing was impaired.   Imitrex was unhelpful for rescue but maxalt was beneficial. She is unclear as to why it was stopped.   She tried topamax but it had to be stopped due to a drop in her blood pressure. She hasn't tried vpa, lyrica, neurontin, or tegretol.   Pain Inventory Average Pain 8 Pain Right Now 8 My pain is intermittent and aching  In the last 24 hours, has pain interfered with the following? General  activity 4 Relation with others 4 Enjoyment of life 4 What TIME of day is your pain at its worst? Morning and Daytime Sleep (in general) Fair  Pain is worse with: bending and inactivity Pain improves with: heat/ice, therapy/exercise, pacing activities and medication Relief from Meds: 5  Mobility walk without assistance how many minutes can you walk? 2 hours ability to climb steps?  yes do you drive?  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  no  Physicians involved in your care Primary care        Review of Systems  Constitutional: Positive for unexpected weight change.  HENT: Negative.   Eyes: Negative.   Respiratory: Negative.   Cardiovascular: Negative.   Gastrointestinal: Negative.   Genitourinary: Negative.   Musculoskeletal: Negative.   Skin: Negative.   Neurological: Positive for dizziness.  Hematological: Negative.   Psychiatric/Behavioral: Negative.        Objective:   Physical Exam  Constitutional: She is oriented to person, place, and time. She appears well-developed.  HENT:  Head: Normocephalic.  Eyes: EOM are normal. Pupils are equal, round, and reactive to light.  Neck: Normal range of motion.  Cardiovascular: Normal rate and regular rhythm.   Pulmonary/Chest: Effort normal.  Abdominal: Soft.  Musculoskeletal:  Arms:      SCM non tender. Minimal tenderness on spinous processes.    Right shouder non-tender with RTC or biceps provocation.  Sitting posture is fair.  She has a bit of a head forward position.  Neurological: She is alert and oriented to person, place, and time. She has normal strength and normal reflexes. No cranial nerve deficit or sensory deficit. She displays a negative Romberg sign.       Mild senstivity over the right temporal region. Occiput was non-tender          Assessment & Plan:  1. 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 I suspect that her pain has an arthritic component which is flaring her myofascial symptoms. 2. Chronic migraine headaches with aura driven by #1 3. Obesity. 4. Vitamin D deficiency  Plan: 1. C-spine xrays with flexion extension views to assess for significant structural issues.  2. Potential trial of outpt therapy once xrays are reviewed and deemed stable. 3. D/c zonegran.  Trial of neurontin 100mg  tid starting initially at qhs for migraine prophylaxis 4. meloxicam 15 mg qday for cervical/shoulder girdle pain. 5. Consider TPI's and botox 6. Maxalt for rescue migraine sx 7. See me back in about a month 8. Encouraged exercise and good health hygiene (including abstinence from caffeine 9. Checked vitamin d level today

## 2011-05-10 NOTE — Patient Instructions (Signed)
Take one zonegran for 3 days then stop.  Avoid caffeine

## 2011-05-11 ENCOUNTER — Telehealth: Payer: Self-pay | Admitting: Physical Medicine & Rehabilitation

## 2011-05-11 NOTE — Telephone Encounter (Signed)
We can pursue PT if Misty Barnes would like.  i don't see any major findings on her spine xray

## 2011-05-12 ENCOUNTER — Telehealth: Payer: Self-pay | Admitting: *Deleted

## 2011-05-12 DIAGNOSIS — IMO0001 Reserved for inherently not codable concepts without codable children: Secondary | ICD-10-CM

## 2011-05-12 NOTE — Telephone Encounter (Signed)
C-spine x-rays showed nothing significant, and PT would be safe. States would like to do therapy at [facility]. Will send order over and she should await their call to schedule. Call us back if she doesn't hear from them in 7-10 days.

## 2011-05-13 NOTE — Telephone Encounter (Signed)
Addended by: Faith Rogue T on: 05/13/2011 10:45 AM   Modules accepted: Orders

## 2011-05-13 NOTE — Telephone Encounter (Signed)
Order placed

## 2011-05-14 LAB — VITAMIN D 1,25 DIHYDROXY
Vitamin D2 1, 25 (OH)2: <8 pg/mL
Vitamin D3 1, 25 (OH)2: 94 pg/mL

## 2011-05-15 ENCOUNTER — Telehealth: Payer: Self-pay | Admitting: Physical Medicine & Rehabilitation

## 2011-05-15 NOTE — Telephone Encounter (Signed)
Let her know vitamin d level is in fact high.  She could back down her supplement to every other week

## 2011-05-16 ENCOUNTER — Telehealth: Payer: Self-pay | Admitting: *Deleted

## 2011-05-16 NOTE — Telephone Encounter (Signed)
Advised to decrease Vitamin D supplement to every other week, lab value was high.

## 2011-05-19 ENCOUNTER — Encounter: Payer: Self-pay | Admitting: Physical Medicine & Rehabilitation

## 2011-05-27 ENCOUNTER — Telehealth: Payer: Self-pay | Admitting: *Deleted

## 2011-05-27 NOTE — Telephone Encounter (Signed)
Hasn't heard from PT yet. Checked and referral in system. Call to [facility], do not have the referral. We will resend it. Ms Linville notified by phone w/ apologies for the delay.

## 2011-06-07 ENCOUNTER — Ambulatory Visit: Payer: 59 | Attending: Physical Medicine & Rehabilitation | Admitting: Physical Therapy

## 2011-06-07 DIAGNOSIS — M2560 Stiffness of unspecified joint, not elsewhere classified: Secondary | ICD-10-CM | POA: Insufficient documentation

## 2011-06-07 DIAGNOSIS — M542 Cervicalgia: Secondary | ICD-10-CM | POA: Insufficient documentation

## 2011-06-07 DIAGNOSIS — IMO0001 Reserved for inherently not codable concepts without codable children: Secondary | ICD-10-CM | POA: Insufficient documentation

## 2011-06-07 DIAGNOSIS — M6281 Muscle weakness (generalized): Secondary | ICD-10-CM | POA: Insufficient documentation

## 2011-06-14 ENCOUNTER — Ambulatory Visit: Payer: 59 | Admitting: Physical Therapy

## 2011-06-16 ENCOUNTER — Ambulatory Visit: Payer: 59 | Admitting: Physical Therapy

## 2011-06-20 ENCOUNTER — Ambulatory Visit: Payer: 59 | Admitting: Physical Therapy

## 2011-06-21 ENCOUNTER — Encounter: Payer: Self-pay | Admitting: Physical Medicine & Rehabilitation

## 2011-06-21 ENCOUNTER — Encounter: Payer: 59 | Attending: Physical Medicine & Rehabilitation | Admitting: Physical Medicine & Rehabilitation

## 2011-06-21 VITALS — BP 109/66 | HR 95 | Resp 18 | Ht 65.5 in | Wt 234.2 lb

## 2011-06-21 DIAGNOSIS — M542 Cervicalgia: Secondary | ICD-10-CM

## 2011-06-21 DIAGNOSIS — M7918 Myalgia, other site: Secondary | ICD-10-CM

## 2011-06-21 DIAGNOSIS — E559 Vitamin D deficiency, unspecified: Secondary | ICD-10-CM

## 2011-06-21 DIAGNOSIS — G43119 Migraine with aura, intractable, without status migrainosus: Secondary | ICD-10-CM | POA: Insufficient documentation

## 2011-06-21 DIAGNOSIS — IMO0001 Reserved for inherently not codable concepts without codable children: Secondary | ICD-10-CM | POA: Insufficient documentation

## 2011-06-21 MED ORDER — GABAPENTIN 300 MG PO CAPS: 300.0000 mg | ORAL_CAPSULE | Freq: Three times a day (TID) | ORAL | Status: DC

## 2011-06-21 NOTE — Progress Notes (Signed)
Subjective:    Patient ID: Misty Barnes, female    DOB: Oct 29, 1970, 41 y.o.   MRN: [id number]  HPI  [name] is back regarding her headaches and cerviclalgia. The neurontin has helped her headaches quite a bit. She's only had 6 this past month, and a lot of those she attributes to allergies and the abrupt weather changes. Her maxalt generally helps for her breakthrough migraine headaches. She uses maxalt for her breakthrough headaches up to twice daily.  Her neck pain is almost resolved. She has had great results with PT. She is recognizing some of the irritating factors while she works as well. She continues on the mobic and occasionally will use her flexeril.     Pain Inventory Average Pain 2 Pain Right Now 7 My pain is intermittent and dull  In the last 24 hours, has pain interfered with the following? General activity 3 Relation with others 2 Enjoyment of life 2 What TIME of day is your pain at its worst? morning Sleep (in general) Fair  Pain is worse with: some activites Pain improves with: heat/ice, therapy/exercise and medication Relief from Meds: 8  Mobility walk without assistance how many minutes can you walk? 60 ability to climb steps?  yes do you drive?  yes Do you have any goals in this area?  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  no  Physicians involved in your care Any changes since last visit?  no      Review of Systems  Respiratory:       Upper respiratory infection  Musculoskeletal:       Left shoulder spasm  Neurological: Positive for headaches.  All other systems reviewed and are negative.       Objective:   Physical Exam  Constitutional: She is oriented to person, place, and time. She appears well-developed and well-nourished.  HENT:  Head: Normocephalic and atraumatic.  Eyes: Conjunctivae and EOM are normal. Pupils are equal, round,  and reactive to light.  Neck: Normal range of motion. Neck supple.  Cardiovascular: Normal rate and regular rhythm.   Pulmonary/Chest: Effort normal and breath sounds normal.  Abdominal: Soft. Bowel sounds are normal.  Musculoskeletal:       Good ROM in neck. Painless movement today. Minimal pain in cervical paraspinals, scm, and traps.   Neurological: She is alert and oriented to person, place, and time. She has normal strength. No cranial nerve deficit or sensory deficit.  Psychiatric: She has a normal mood and affect. Her behavior is normal. Judgment and thought content normal.          Assessment & Plan:  1. 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 This has improved greatly with therapy and home maintenance. 2. Chronic migraine headaches with aura improved with neurontin 3. Obesity.  4. Vitamin D deficiency  Plan:  1. Continue outpt PT per PT direction until the end of May. This has made a dramatic difference in her cervicalgia.  I explained to her that she needs to keep up with a home program and practice good posture for the rest of her life essentially. 2. Will increase neurontin to 300mg  TID to see if we can further decrease her headache frequency.  I gave her a titration schedule and instructions on what to do if the dose is too much for her to handle. 3. Continue Vit D supp  twice weekly. We can recheck a level this summer., 4. Recommended no more thand 2 doses of maxalt on a given day. 5. I'll see her back in 3 months. She is quite pleased with her progress.

## 2011-06-21 NOTE — Patient Instructions (Signed)
neurontin  300mg  qhs, 100mg  bid x 5 days, then 300mg  qhs and in am  With 100mg  mid day x 5 days, then 300mg  tid

## 2011-06-22 ENCOUNTER — Encounter: Payer: Self-pay | Admitting: Physical Medicine & Rehabilitation

## 2011-06-22 ENCOUNTER — Encounter: Payer: 59 | Admitting: Physical Therapy

## 2011-06-23 ENCOUNTER — Ambulatory Visit: Payer: 59 | Attending: Physical Medicine & Rehabilitation | Admitting: Physical Therapy

## 2011-06-23 DIAGNOSIS — IMO0001 Reserved for inherently not codable concepts without codable children: Secondary | ICD-10-CM | POA: Insufficient documentation

## 2011-06-23 DIAGNOSIS — M542 Cervicalgia: Secondary | ICD-10-CM | POA: Insufficient documentation

## 2011-06-23 DIAGNOSIS — M6281 Muscle weakness (generalized): Secondary | ICD-10-CM | POA: Insufficient documentation

## 2011-06-23 DIAGNOSIS — M2560 Stiffness of unspecified joint, not elsewhere classified: Secondary | ICD-10-CM | POA: Insufficient documentation

## 2011-06-28 ENCOUNTER — Ambulatory Visit: Payer: 59 | Admitting: Physical Therapy

## 2011-06-30 ENCOUNTER — Ambulatory Visit: Payer: 59 | Admitting: Physical Therapy

## 2011-07-04 ENCOUNTER — Ambulatory Visit: Payer: 59 | Admitting: Physical Therapy

## 2011-07-06 ENCOUNTER — Ambulatory Visit: Payer: 59 | Admitting: Physical Therapy

## 2011-07-11 ENCOUNTER — Ambulatory Visit: Payer: 59 | Admitting: Physical Therapy

## 2011-07-13 ENCOUNTER — Encounter: Payer: 59 | Admitting: Physical Therapy

## 2011-07-14 ENCOUNTER — Encounter: Payer: 59 | Admitting: Rehabilitation

## 2011-07-14 ENCOUNTER — Ambulatory Visit: Payer: 59 | Admitting: Rehabilitative and Restorative Service Providers"

## 2011-07-20 ENCOUNTER — Ambulatory Visit: Payer: 59 | Admitting: Physical Therapy

## 2011-07-25 ENCOUNTER — Encounter: Payer: 59 | Admitting: Physical Therapy

## 2011-07-27 ENCOUNTER — Encounter: Payer: 59 | Admitting: Physical Therapy

## 2011-08-01 ENCOUNTER — Other Ambulatory Visit: Payer: Self-pay | Admitting: Obstetrics and Gynecology

## 2011-08-01 DIAGNOSIS — Z1231 Encounter for screening mammogram for malignant neoplasm of breast: Secondary | ICD-10-CM

## 2011-08-29 ENCOUNTER — Ambulatory Visit (HOSPITAL_COMMUNITY): Payer: 59

## 2011-09-16 ENCOUNTER — Ambulatory Visit (HOSPITAL_COMMUNITY)
Admission: RE | Admit: 2011-09-16 | Discharge: 2011-09-16 | Disposition: A | Discharge status: Home / Self Care | Payer: 59 | Source: Ambulatory Visit | Attending: Obstetrics and Gynecology | Admitting: Obstetrics and Gynecology

## 2011-09-16 DIAGNOSIS — Z1231 Encounter for screening mammogram for malignant neoplasm of breast: Secondary | ICD-10-CM | POA: Insufficient documentation

## 2011-09-20 ENCOUNTER — Encounter: Payer: 59 | Attending: Physical Medicine & Rehabilitation | Admitting: Physical Medicine & Rehabilitation

## 2011-09-20 ENCOUNTER — Other Ambulatory Visit: Payer: Self-pay | Admitting: Obstetrics and Gynecology

## 2011-09-20 ENCOUNTER — Encounter: Payer: Self-pay | Admitting: Physical Medicine & Rehabilitation

## 2011-09-20 VITALS — BP 127/60 | HR 72 | Resp 16 | Ht 65.5 in | Wt 247.0 lb

## 2011-09-20 DIAGNOSIS — E669 Obesity, unspecified: Secondary | ICD-10-CM

## 2011-09-20 DIAGNOSIS — E559 Vitamin D deficiency, unspecified: Secondary | ICD-10-CM

## 2011-09-20 DIAGNOSIS — IMO0001 Reserved for inherently not codable concepts without codable children: Secondary | ICD-10-CM

## 2011-09-20 DIAGNOSIS — G43909 Migraine, unspecified, not intractable, without status migrainosus: Secondary | ICD-10-CM | POA: Insufficient documentation

## 2011-09-20 DIAGNOSIS — M7918 Myalgia, other site: Secondary | ICD-10-CM

## 2011-09-20 DIAGNOSIS — G43119 Migraine with aura, intractable, without status migrainosus: Secondary | ICD-10-CM

## 2011-09-20 DIAGNOSIS — R928 Other abnormal and inconclusive findings on diagnostic imaging of breast: Secondary | ICD-10-CM

## 2011-09-20 DIAGNOSIS — M542 Cervicalgia: Secondary | ICD-10-CM

## 2011-09-20 MED ORDER — RIZATRIPTAN BENZOATE 5 MG PO TABS: 5.0000 mg | ORAL_TABLET | ORAL | Status: DC | PRN

## 2011-09-20 NOTE — Patient Instructions (Signed)
TRY GETTING YOUR HR INTO THE 120 RANGE WHEN YOU EXERCISE.  TRY EXERCISING MORE THAN 3 DAYS PER WEEK (5 DAYS IS OPTIMAL)

## 2011-09-20 NOTE — Progress Notes (Signed)
Subjective:    Patient ID: Misty Barnes, female    DOB: [birth year], 41 y.o.   MRN: [id number]  HPI  Chloris is back regarding her headaches. Her headaches have been under excellent control. She is concerned that she's gaining weight. She has put on about 14 lbs over the last few months. She is exercising but can't lose any weight. She tells me she goes to the gym 3 days a week and generally works out for about 2 hours. She uses the treadmill, stationary bike, and eliptical. Her HR only increases to around 80 or 90 BPM with exercises.  Her headaches have been under great control. She is very pleased with this. She tries to keep up with her ROM exercises as outline by therapy.    Pain Inventory Average Pain 2 Pain Right Now 0 My pain is intermittent and dull  In the last 24 hours, has pain interfered with the following? General activity 0 Relation with others 0 Enjoyment of life 0 What TIME of day is your pain at its worst? morning Sleep (in general) Good  Pain is worse with: inactivity Pain improves with: rest, heat/ice and therapy/exercise Relief from Meds: not taking pain medication  Mobility walk without assistance how many minutes can you walk? 60 ability to climb steps?  yes do you drive?  yes  Function employed # of hrs/week 36-48  Neuro/Psych No problems in this area  Prior Studies Any changes since last visit?  no  Physicians involved in your care Any changes since last visit?  no   Family History  Problem Relation Age of Onset  . Hypertension Father   . Multiple sclerosis Mother    History   Social History  . Marital Status: Married    Spouse Name: N/A    Number of Children: N/A  . Years of Education: N/A   Social History Main Topics  . Smoking status: Never Smoker   . Smokeless tobacco: Never Used  . Alcohol Use: No  . Drug Use: No  . Sexually Active: None   Other Topics Concern  . None   Social History Narrative  . None   Past  Surgical History  Procedure Date  . Endometrial ablation 2005  . Lipoma excision     cs  . Cesarean section     x 2  . Hernia repair   . Cystoscopy 01/10/2011    Procedure: CYSTOSCOPY;  Surgeon: Edwena Felty Romine;  Location: [facility];  Service: Gynecology;  Laterality: N/A;  . Kidney stone surgery   . Abdominal hysterectomy    Past Medical History  Diagnosis Date  . PONV (postoperative nausea and vomiting)   . Headache     migraines  . Chronic kidney disease     stones  . Hypertension    BP 127/60  Pulse 72  Resp 16  Ht 5' 5.5" (1.664 m)  Wt 247 lb (112.038 kg)  BMI 40.48 kg/m2  SpO2 99%  LMP 11/07/2010    Review of Systems  Skin: Positive for rash.  All other systems reviewed and are negative.       Objective:   Physical Exam  Constitutional: She is oriented to person, place, and time. She appears well-developed and well-nourished.  HENT:  Head: Normocephalic and atraumatic.  Eyes: Conjunctivae and EOM are normal. Pupils are equal, round, and reactive to light.  Neck: Normal range of motion. Neck supple.  Cardiovascular: Normal rate and regular rhythm.  Pulmonary/Chest: Effort normal and breath  sounds normal.  Abdominal: Soft. Bowel sounds are normal.  Musculoskeletal:  Good ROM in neck. Painless movement today. Minimal pain in cervical paraspinals, scm, and traps.  Neurological: She is alert and oriented to person, place, and time. She has normal strength. No cranial nerve deficit or sensory deficit.  Psychiatric: She has a normal mood and affect. Her behavior is normal. Judgment and thought content normal.    Assessment & Plan:   1. 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 This has improved greatly with therapy and home maintenance.  2. Chronic migraine headaches with aura improved with neurontin  3. Obesity.  4. Vitamin D deficiency-improved Plan:  1. Continue with home exercise program. Discussed the fact that if she  wants to exercise to help lose weight, that she will need to perform more vigorous activity with her HR in the 120 to 120 range. I advised her to use the pulse guides on the equipment she is using to monitor her HR. She should gradually increase her work out intensity to get into that range. 2. To assist with her weigh, i advised that she decrease her neurontin back to 300mg  qhs. If her headaches should increase, then she should go back to bid. 3. Continue Vit D supp OTC. I refilled her maxalt today #10.  4. Referral was made to [facility] dietary clinic for recs on diet for weight mgt. 5. I'll see her back in 6 months. She is quite pleased with her progress

## 2011-09-26 ENCOUNTER — Ambulatory Visit
Admission: RE | Admit: 2011-09-26 | Discharge: 2011-09-26 | Disposition: A | Discharge status: Home / Self Care | Payer: 59 | Source: Ambulatory Visit | Attending: Obstetrics and Gynecology | Admitting: Obstetrics and Gynecology

## 2011-09-26 DIAGNOSIS — R928 Other abnormal and inconclusive findings on diagnostic imaging of breast: Secondary | ICD-10-CM

## 2011-10-26 ENCOUNTER — Ambulatory Visit (INDEPENDENT_AMBULATORY_CARE_PROVIDER_SITE_OTHER): Payer: Commercial Managed Care - PPO | Admitting: Surgery

## 2011-10-26 ENCOUNTER — Encounter (INDEPENDENT_AMBULATORY_CARE_PROVIDER_SITE_OTHER): Payer: Self-pay | Admitting: Surgery

## 2011-10-26 VITALS — BP 118/74 | HR 84 | Temp 97.2°F | Resp 20 | Ht 65.5 in | Wt 241.6 lb

## 2011-10-26 DIAGNOSIS — N6310 Unspecified lump in the right breast, unspecified quadrant: Secondary | ICD-10-CM

## 2011-10-26 DIAGNOSIS — L732 Hidradenitis suppurativa: Secondary | ICD-10-CM | POA: Insufficient documentation

## 2011-10-26 DIAGNOSIS — N63 Unspecified lump in unspecified breast: Secondary | ICD-10-CM

## 2011-10-26 MED ORDER — DOXYCYCLINE HYCLATE 100 MG PO TABS: 100.0000 mg | ORAL_TABLET | Freq: Two times a day (BID) | ORAL | Status: AC

## 2011-10-26 NOTE — Patient Instructions (Addendum)
We will arrange a needle biopsy of the small mass in the right breast. We will start you on some antibiotic for the hidradenitis of the right armpit area

## 2011-10-26 NOTE — Progress Notes (Signed)
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 After discussion we will arrange for a NCB at the breast center.  The hidreadenitis is minimally active, but since it is painful we will try her on a week of doxy 100BID                 CC:  Chief Complaint  Patient presents with  . Breast Mass    eval rt br mass    HPI:  Misty Barnes is a 41 y.o.  female who presents for evaluation of a right breast mass, and also wishes to discuss her hidradenitis. The breast mass was found on routine mammo and on sono looks like a fibroadenoma. She wishes to discuss alternatives for evaluation and management. She has no symptoms and has a negative family history for breast cancer.  She has been dx with axillary hidradenitis and it flares from time to time. Recently she has had more discomfort in the right area wits some minimal drainage. She has used topical bacitracin at times which has provided only temporary relief.    PMH:  has a past medical history of PONV (postoperative nausea and vomiting); Headache; Chronic kidney disease; and Hypertension.  PSH:   has past surgical history that includes Endometrial ablation (2005); Lipoma excision; Cesarean section; Hernia repair; Cystoscopy (01/10/2011); Kidney stone surgery; and Abdominal hysterectomy.  ALLERGIES:   Allergies  Allergen Reactions  . Penicillins Anaphylaxis    MEDICATIONS: Current outpatient prescriptions:acetaminophen (TYLENOL) 500 MG tablet, Take 500 mg by mouth every 6 (six) hours as needed.  , Disp: , Rfl: ;  cyclobenzaprine (FLEXERIL) 10 MG tablet, Take 10 mg by mouth  2 (two) times daily as needed., Disp: , Rfl: ;  gabapentin (NEURONTIN) 300 MG capsule, Take 1 capsule (300 mg total) by mouth 3 (three) times daily., Disp: 90 capsule, Rfl: 5 hydrOXYzine (ATARAX/VISTARIL) 25 MG tablet, Take 25 mg by mouth daily as needed. For itching , Disp: , Rfl: ;  meloxicam (MOBIC) 7.5 MG tablet, Take 1 tablet (7.5 mg total) by mouth daily., Disp: 30 tablet, Rfl: 0;  Multiple Vitamins-Calcium (ONE-A-DAY WOMENS FORMULA PO), Take 1 tablet by mouth daily., Disp: , Rfl: ;  Polyethyl Glycol-Propyl Glycol (SYSTANE) 0.4-0.3 % SOLN, Apply 1 drop to eye 4 (four) times daily as needed., Disp: 5 mL, Rfl: 0 Pseudoephedrine-Guaifenesin (MUCINEX D) [id number] MG TB12, Take 1 tablet by mouth 2 (two) times daily as needed (congestion)., Disp: 20 each, Rfl: 0;  rizatriptan (MAXALT) 5 MG tablet, Take 1 tablet (5 mg total) by mouth as needed for migraine. May repeat in 2 hours if needed, Disp: 10 tablet, Rfl: 3;  doxycycline (VIBRA-TABS) 100 MG tablet, Take 1 tablet (100 mg total) by mouth 2 (two) times daily., Disp:  14 tablet, Rfl: 2 DISCONTD: DULoxetine (CYMBALTA) 60 MG capsule, Take 60 mg by mouth daily.  , Disp: , Rfl:   ROS: She has filled out our 12 point review of systems and it is negative. EXAM:   Vital signs: BP 118/74  Pulse 84  Temp 97.2 F (36.2 C) (Temporal)  Resp 20  Ht 5' 5.5" (1.664 m)  Wt 241 lb 9.6 oz (109.589 kg)  BMI 39.59 kg/m2  LMP 11/07/2010 Breasts; Symmetric with no dominant mass, not tender , normal Lymphatics: NO axillary or supraclvicular adenopathy. Skin: a few open draining areas in Rioght axilla, all superficial and no frank abscess  DATA REVIEWED:  Mammogram and ultrasound reports are noted    Misty Barnes J 10/26/2011  CC: Misty Barnes, Misty Felty, MD, Pcp Not In System

## 2011-10-26 NOTE — Addendum Note (Signed)
Addended byLiliana Cline on: 10/26/2011 04:03 PM   Modules accepted: Orders

## 2011-11-04 ENCOUNTER — Ambulatory Visit
Admission: RE | Admit: 2011-11-04 | Discharge: 2011-11-04 | Disposition: A | Discharge status: Home / Self Care | Payer: 59 | Source: Ambulatory Visit | Attending: Surgery | Admitting: Surgery

## 2011-11-04 DIAGNOSIS — N6310 Unspecified lump in the right breast, unspecified quadrant: Secondary | ICD-10-CM

## 2011-11-11 ENCOUNTER — Telehealth (INDEPENDENT_AMBULATORY_CARE_PROVIDER_SITE_OTHER): Payer: Self-pay | Admitting: General Surgery

## 2011-11-11 NOTE — Telephone Encounter (Signed)
Called patient to make sure she was contacted about pathology results. She is aware this is a fibroadenoma and will call us if she wants to discuss surgery.

## 2011-12-15 ENCOUNTER — Ambulatory Visit (INDEPENDENT_AMBULATORY_CARE_PROVIDER_SITE_OTHER): Payer: 59 | Admitting: Family Medicine

## 2011-12-15 ENCOUNTER — Encounter: Payer: Self-pay | Admitting: Family Medicine

## 2011-12-15 VITALS — Ht 65.5 in | Wt 238.5 lb

## 2011-12-15 DIAGNOSIS — Z6839 Body mass index (BMI) 39.0-39.9, adult: Secondary | ICD-10-CM | POA: Insufficient documentation

## 2011-12-15 DIAGNOSIS — E669 Obesity, unspecified: Secondary | ICD-10-CM

## 2011-12-15 NOTE — Patient Instructions (Addendum)
-   Eat at least 3 meals and 1-2 snacks per day.  Aim for no more than 5 hours between eating.   4 or 5 AM:  Breakfast (pack a "bag lunch," i.e., sandwich, fruit, yogurt)  10 AM:  Snack (individual portions of nuts/seeds; fruit; yogurt; 1/2 sandwich, apple sauce, cereal [choose one with at least 5 g of fiber/serving])  5 PM:  Lunch   10:30-12:30:   Dinner (pack a "bag lunch," i.e., sandwich, veg's, fruit, yogurt or leftovers from lunch or salad) - Reminder:  In place of bread on some days:  Wraps, tortillas, whole-grain crackers, English muffins, sandwich thins - Obtain twice as many veg's as protein or carbohydrate foods for both lunch and dinner. - Continue exercise pattern.  Great job! - Switch to 1% milk (& eventually to fat-free).

## 2011-12-15 NOTE — Progress Notes (Signed)
Medical Nutrition Therapy:  Appt start time: 1330 end time:  1430.  Assessment:  Primary concerns today: Weight management and better nutrition.  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  She is the single mother of a 12-YO girl.   Usual eating pattern includes 2-3 meals and 1 snack per day; gets breakfast 2-5~X wk.  Usual eating times: B- 7- or 8 AM; Sleeps ~10 or 10:30-3 PM; D- 5 PM; to work ~7 PM; Snk- 12:30 AM; Snk- 4 AM or 7 AM; Walk 7-9 AM.  On days off, more consistently get 3 meals a day.   Usual physical activity includes walking 5-6.5 mi (~90+ min) 5 X wk; goes to [employer] if can't walk that day.    Everyday foods include bread ("my weakness"), baked chx, pasta, broccoli, other veg's, ~48 oz diet drinks/day.  Avoided foods include fish (anaphylactic), peanut (according to allergy skin testing).   24-hr recall: B (7:30 AM)-   BK sausage & cheese croissant, 16 oz o.j.  Snk ( AM)-   water L ( PM)-  16 oz water (felt bad w/ allergies) Snk ( PM)-  32 oz water D (6:30 PM)-  [place] 2 chx soft tacos, water, 1 1/2 c rice  Snk (8:30)-  1 slice lemon cake, water Allergies often affect appetite.    Progress Towards Goal(s):  In progress.   Nutritional Diagnosis:  [place]-3.3 Overweight/obesity As related to poor energy balance.  As evidenced by BMI of 39.    Intervention:  Nutrition education.  Monitoring/Evaluation:  Dietary intake, exercise, and body weight in 1 month(s).

## 2012-01-12 ENCOUNTER — Ambulatory Visit: Payer: 59 | Admitting: Family Medicine

## 2012-03-19 ENCOUNTER — Encounter: Payer: 59 | Attending: Physical Medicine & Rehabilitation | Admitting: Physical Medicine & Rehabilitation

## 2012-03-27 ENCOUNTER — Encounter (INDEPENDENT_AMBULATORY_CARE_PROVIDER_SITE_OTHER): Payer: Self-pay

## 2012-11-15 ENCOUNTER — Telehealth: Payer: Self-pay | Admitting: *Deleted

## 2012-11-15 ENCOUNTER — Encounter: Payer: Self-pay | Admitting: Obstetrics & Gynecology

## 2012-11-15 NOTE — Telephone Encounter (Signed)
11/15/12 T/C to patient Misty Barnes, patient had a U/S guided vacuum assisted Core Bx of Rt. Breast 11/04/11. (Path report showed Fibroadenoma) Has not had her screening mammogram was due 09/15/12. A letter was sent from [facility] 08/2012. Please advise .

## 2012-11-15 NOTE — Telephone Encounter (Signed)
Letter printed. Please mail

## 2012-11-21 NOTE — Telephone Encounter (Signed)
Letter mailed 11/16/12 cm

## 2012-12-04 ENCOUNTER — Encounter (HOSPITAL_COMMUNITY): Payer: Self-pay | Admitting: Emergency Medicine

## 2012-12-04 ENCOUNTER — Emergency Department (HOSPITAL_COMMUNITY)
Admission: EM | Admit: 2012-12-04 | Discharge: 2012-12-04 | Disposition: A | Discharge status: Home / Self Care | Payer: 59 | Source: Home / Self Care | Attending: Family Medicine | Admitting: Family Medicine

## 2012-12-04 DIAGNOSIS — M654 Radial styloid tenosynovitis [de Quervain]: Secondary | ICD-10-CM

## 2012-12-04 DIAGNOSIS — L259 Unspecified contact dermatitis, unspecified cause: Secondary | ICD-10-CM

## 2012-12-04 MED ORDER — PREDNISONE 10 MG PO KIT: PACK | ORAL | Status: DC

## 2012-12-04 NOTE — ED Notes (Signed)
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  No otc meds taken for symptoms.  Sitting up right no signs of acute distress.

## 2012-12-04 NOTE — ED Provider Notes (Signed)
Misty Barnes is a 42 y.o. female who presents to Urgent Care today for bilateral hand swelling with right forearm pain. This started Thursday. 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 Additionally she notes a rash appearing on her hands associated with swelling. It is somewhat itchy. She has not tried any medications yet. She denies any shortness of breath which are chronic swelling. She notes her right forearm pain is worse with hand motion. She denies any other radiating pain weakness or numbness. She denies any injury   Past Medical History  Diagnosis Date  . PONV (postoperative nausea and vomiting)   . Headache(784.0)     migraines  . Chronic kidney disease     stones  . Hypertension    History  Substance Use Topics  . Smoking status: Never Smoker   . Smokeless tobacco: Never Used  . Alcohol Use: No   ROS as above Medications reviewed. No current facility-administered medications for this encounter.   Current Outpatient Prescriptions  Medication Sig Dispense Refill  . acetaminophen (TYLENOL) 500 MG tablet Take 500 mg by mouth every 6 (six) hours as needed.        . cyclobenzaprine (FLEXERIL) 10 MG tablet Take 10 mg by mouth 2 (two) times daily as needed.      . gabapentin (NEURONTIN) 300 MG capsule Take 1 capsule (300 mg total) by mouth 3 (three) times daily.  90 capsule  5  . hydrOXYzine (ATARAX/VISTARIL) 25 MG tablet Take 25 mg by mouth daily as needed. For itching       . loratadine (CLARITIN) 10 MG tablet Take 10 mg by mouth daily.      . Multiple Vitamins-Calcium (ONE-A-DAY WOMENS FORMULA PO) Take 1 tablet by mouth daily.      Bertram Gala Glycol-Propyl Glycol (SYSTANE) 0.4-0.3 % SOLN Apply 1 drop to eye 4 (four) times daily as needed.  5 mL  0  . PredniSONE 10 MG KIT 12 day dose pack po  1 kit  0  . Pseudoephedrine-Guaifenesin (MUCINEX D) [phone] MG TB12 Take 1 tablet by mouth 2 (two) times daily as needed  (congestion).  20 each  0  . rizatriptan (MAXALT) 5 MG tablet Take 1 tablet (5 mg total) by mouth as needed for migraine. May repeat in 2 hours if needed  10 tablet  3  . [DISCONTINUED] DULoxetine (CYMBALTA) 60 MG capsule Take 60 mg by mouth daily.          Exam:  BP 105/69  Pulse 89  Temp(Src) 98 F (36.7 C) (Oral)  Resp 16  SpO2 100%  LMP 11/07/2010 Gen: Well NAD HEENT: EOMI,  MMM Lungs: CTABL Nl WOB Heart: RRR no MRG Exts: Non edematous BL  LE, warm and well perfused.  Bilateral hands: Slightly swollen with vesicular appearing rash on the dorsum of both hands. Right forearm radial sided tenderness. Positive Finkelstein's test. Nontender in the anatomical snuffbox. Hand and wrist motion is otherwise normal  No results found for this or any previous visit (from the past 24 hour(s)). No results found.  Assessment and Plan: 42 y.o. female with likely contact dermatitis. Possible de Quervain's tenosynovitis Plan issue with prednisone Dosepak and followup with sports medicine if not improving. Discussed warning signs or symptoms. Please see discharge instructions. Patient expresses understanding.      Rodolph Bong, MD 12/04/12 1228

## 2013-02-17 ENCOUNTER — Emergency Department (HOSPITAL_COMMUNITY)
Admission: EM | Admit: 2013-02-17 | Discharge: 2013-02-17 | Disposition: A | Discharge status: Home / Self Care | Payer: 59 | Source: Home / Self Care | Attending: Emergency Medicine | Admitting: Emergency Medicine

## 2013-02-17 ENCOUNTER — Encounter (HOSPITAL_COMMUNITY): Payer: Self-pay | Admitting: Emergency Medicine

## 2013-02-17 DIAGNOSIS — L259 Unspecified contact dermatitis, unspecified cause: Secondary | ICD-10-CM

## 2013-02-17 DIAGNOSIS — L309 Dermatitis, unspecified: Secondary | ICD-10-CM

## 2013-02-17 MED ORDER — PREDNISONE 10 MG PO TABS: ORAL_TABLET | ORAL | Status: DC

## 2013-02-17 MED ORDER — OXYCODONE-ACETAMINOPHEN 5-325 MG PO TABS: 2.0000 | ORAL_TABLET | ORAL | Status: DC | PRN

## 2013-02-17 MED ORDER — DEXAMETHASONE SODIUM PHOSPHATE 10 MG/ML IJ SOLN
INTRAMUSCULAR | Status: AC
  Filled 2013-02-17: qty 1

## 2013-02-17 MED ORDER — DEXAMETHASONE SODIUM PHOSPHATE 10 MG/ML IJ SOLN
10.0000 mg | Freq: Once | INTRAMUSCULAR | Status: AC
  Administered 2013-02-17: 10 mg via INTRAMUSCULAR

## 2013-02-17 NOTE — ED Provider Notes (Signed)
Medical screening examination/treatment/procedure(s) were performed by non-physician practitioner and as supervising physician I was immediately available for consultation/collaboration.  Manilla Strieter, M.D.  Manraj Yeo C Aksh Swart, MD 02/17/13 2107 

## 2013-02-17 NOTE — ED Provider Notes (Signed)
CSN: [id number]     Arrival date & time 02/17/13  1524 History   First MD Initiated Contact with Patient 02/17/13 1656     Chief Complaint  Patient presents with  . Rash   (Consider location/radiation/quality/duration/timing/severity/associated sxs/prior Treatment) Patient is a 42 y.o. female presenting with rash. The history is provided by the patient. No language interpreter was used.  Rash Location:  Face Facial rash location:  Face Quality: dryness and itchiness   Severity:  Moderate Timing:  Constant Progression:  Worsening Relieved by:  Nothing Ineffective treatments:  None tried Associated symptoms: abdominal pain     Past Medical History  Diagnosis Date  . PONV (postoperative nausea and vomiting)   . Headache(784.0)     migraines  . Chronic kidney disease     stones  . Hypertension    Past Surgical History  Procedure Laterality Date  . Endometrial ablation  2005  . Lipoma excision      cs  . Cesarean section      x 2  . Hernia repair    . Cystoscopy  01/10/2011    Procedure: CYSTOSCOPY;  Surgeon: Edwena Felty Romine;  Location: [facility];  Service: Gynecology;  Laterality: N/A;  . Kidney stone surgery    . Abdominal hysterectomy     Family History  Problem Relation Age of Onset  . Hypertension Father   . Multiple sclerosis Mother    History  Substance Use Topics  . Smoking status: Never Smoker   . Smokeless tobacco: Never Used  . Alcohol Use: No   OB History   Grav Para Term Preterm Abortions TAB SAB Ect Mult Living                 Review of Systems  Gastrointestinal: Positive for abdominal pain.  Skin: Positive for rash.  All other systems reviewed and are negative.  Pt has a history of eczema, atopic dermatitis.  Pt complains of skin behind ear spliting  Allergies  Penicillins  Home Medications   Current Outpatient Rx  Name  Route  Sig  Dispense  Refill  . acetaminophen (TYLENOL) 500 MG tablet   Oral   Take 500 mg by mouth every 6 (six)  hours as needed.           . cyclobenzaprine (FLEXERIL) 10 MG tablet   Oral   Take 10 mg by mouth 2 (two) times daily as needed.         Marland Kitchen EXPIRED: gabapentin (NEURONTIN) 300 MG capsule   Oral   Take 1 capsule (300 mg total) by mouth 3 (three) times daily.   90 capsule   5   . hydrOXYzine (ATARAX/VISTARIL) 25 MG tablet   Oral   Take 25 mg by mouth daily as needed. For itching          . loratadine (CLARITIN) 10 MG tablet   Oral   Take 10 mg by mouth daily.         . Multiple Vitamins-Calcium (ONE-A-DAY WOMENS FORMULA PO)   Oral   Take 1 tablet by mouth daily.         Marland Kitchen oxyCODONE-acetaminophen (PERCOCET/ROXICET) 5-325 MG per tablet   Oral   Take 2 tablets by mouth every 4 (four) hours as needed for severe pain.   16 tablet   0   . Polyethyl Glycol-Propyl Glycol (SYSTANE) 0.4-0.3 % SOLN   Ophthalmic   Apply 1 drop to eye 4 (four) times daily as needed.  5 mL   0   . predniSONE (DELTASONE) 10 MG tablet      6,6,5,5,4,4,3,3,2,2,1,1 taper   42 tablet   0   . PredniSONE 10 MG KIT      12 day dose pack po   1 kit   0   . Pseudoephedrine-Guaifenesin (MUCINEX D) [phone] MG TB12   Oral   Take 1 tablet by mouth 2 (two) times daily as needed (congestion).   20 each   0   . EXPIRED: rizatriptan (MAXALT) 5 MG tablet   Oral   Take 1 tablet (5 mg total) by mouth as needed for migraine. May repeat in 2 hours if needed   10 tablet   3    BP 111/79  Pulse 84  Temp(Src) 98.6 F (37 C) (Oral)  Resp 14  SpO2 98%  LMP 11/07/2010 Physical Exam  Nursing note and vitals reviewed. Constitutional: She is oriented to person, place, and time. She appears well-developed and well-nourished.  HENT:  Head: Normocephalic.  Eyes: EOM are normal.  Neck: Normal range of motion.  Cardiovascular: Normal rate.   Pulmonary/Chest: Effort normal.  Abdominal: She exhibits no distension.  Musculoskeletal: Normal range of motion.  Neurological: She is alert and oriented to  person, place, and time.  Skin: Rash noted. There is erythema.  Psychiatric: She has a normal mood and affect.    ED Course  Procedures (including critical care time) Labs Review Labs Reviewed - No data to display Imaging Review No results found.  EKG Interpretation    Date/Time:    Ventricular Rate:    PR Interval:    QRS Duration:   QT Interval:    QTC Calculation:   R Axis:     Text Interpretation:              MDM   1. Dermatitis    Decadron IM.   Pt given prednisone taper,   Pt advised to follow up with her MD as scheduled for recheck   Elson Areas, PA-C 02/17/13 1957  Lonia Skinner Beason, [place] 02/17/13 1958

## 2013-02-17 NOTE — ED Notes (Signed)
Pt  Reports     She  Had   A  Flair  Up  Of  Her  Atypical  Dermatitis   With     Pain   And  Rash     On  Face  And  Upper  Torso  She reports   Flair   Up           she  Is  Sitting  Upright  Slightly  Anxious   And      In  Discomfort

## 2013-09-04 ENCOUNTER — Other Ambulatory Visit: Payer: Self-pay | Admitting: Physical Medicine & Rehabilitation

## 2014-01-15 ENCOUNTER — Encounter (HOSPITAL_COMMUNITY): Payer: Self-pay | Admitting: *Deleted

## 2014-01-15 ENCOUNTER — Emergency Department (HOSPITAL_COMMUNITY)
Admission: EM | Admit: 2014-01-15 | Discharge: 2014-01-15 | Disposition: A | Discharge status: Home / Self Care | Payer: 59 | Attending: Emergency Medicine | Admitting: Emergency Medicine

## 2014-01-15 DIAGNOSIS — G43909 Migraine, unspecified, not intractable, without status migrainosus: Secondary | ICD-10-CM | POA: Insufficient documentation

## 2014-01-15 DIAGNOSIS — Z87442 Personal history of urinary calculi: Secondary | ICD-10-CM | POA: Insufficient documentation

## 2014-01-15 DIAGNOSIS — L309 Dermatitis, unspecified: Secondary | ICD-10-CM | POA: Insufficient documentation

## 2014-01-15 DIAGNOSIS — Z7951 Long term (current) use of inhaled steroids: Secondary | ICD-10-CM | POA: Diagnosis not present

## 2014-01-15 DIAGNOSIS — N189 Chronic kidney disease, unspecified: Secondary | ICD-10-CM | POA: Insufficient documentation

## 2014-01-15 DIAGNOSIS — Z79899 Other long term (current) drug therapy: Secondary | ICD-10-CM | POA: Insufficient documentation

## 2014-01-15 DIAGNOSIS — I129 Hypertensive chronic kidney disease with stage 1 through stage 4 chronic kidney disease, or unspecified chronic kidney disease: Secondary | ICD-10-CM | POA: Diagnosis not present

## 2014-01-15 DIAGNOSIS — Z880 Allergy status to penicillin: Secondary | ICD-10-CM | POA: Insufficient documentation

## 2014-01-15 DIAGNOSIS — R21 Rash and other nonspecific skin eruption: Secondary | ICD-10-CM | POA: Diagnosis present

## 2014-01-15 DIAGNOSIS — B085 Enteroviral vesicular pharyngitis: Secondary | ICD-10-CM | POA: Insufficient documentation

## 2014-01-15 MED ORDER — PREDNISONE 20 MG PO TABS: ORAL_TABLET | ORAL | Status: DC

## 2014-01-15 MED ORDER — SODIUM CHLORIDE 0.9 % IV BOLUS (SEPSIS)
1000.0000 mL | Freq: Once | INTRAVENOUS | Status: AC
  Administered 2014-01-15: 1000 mL via INTRAVENOUS

## 2014-01-15 MED ORDER — EPINEPHRINE 0.3 MG/0.3ML IJ SOAJ: 0.3000 mg | Freq: Once | INTRAMUSCULAR | Status: DC

## 2014-01-15 MED ORDER — ACYCLOVIR 200 MG PO CAPS
400.0000 mg | ORAL_CAPSULE | Freq: Once | ORAL | Status: AC
  Administered 2014-01-15: 400 mg via ORAL
  Filled 2014-01-15 (×2): qty 2

## 2014-01-15 MED ORDER — MORPHINE SULFATE 4 MG/ML IJ SOLN
4.0000 mg | Freq: Once | INTRAMUSCULAR | Status: AC
  Administered 2014-01-15: 4 mg via INTRAVENOUS
  Filled 2014-01-15: qty 1

## 2014-01-15 MED ORDER — ACYCLOVIR 200 MG PO CAPS: 400.0000 mg | ORAL_CAPSULE | Freq: Every day | ORAL | Status: DC

## 2014-01-15 NOTE — ED Notes (Signed)
Pt sts feeling so much better.  Dad at bedside.

## 2014-01-15 NOTE — ED Notes (Signed)
Per EMS - patient comes from [place] on [address] with potential allergic reaction.  Patient ate lasagna around midnight today.  Shortly thereafter, patient broke out in hives on trunk and arms.  The hives are itchy and dry.  Patient feels as though there is something pressing against her throat.  No stridor noted, patient speaking full sentences.  Lung sounds clear.  Patient went to her PCP this morning and was referred to ED.  Patient's BP was 120/72, HR 103, Sats 98% on RA.  Patient has 20 gauge RAC.  Patient received 50 mg Benadryl at [facility].  Patient received another 50 mg Benadryl with EMS IVP.  She also received 50 mg Zantac IV, 4 mg Zofran IV and 125 mg of solumedrol with EMS.  Patient has a lot of food allergies and is supposed to have an Epipen at home, but is out of the medication.

## 2014-01-15 NOTE — ED Notes (Signed)
Bed: WA04 Expected date:  Expected time:  Means of arrival:  Comments: EMS/allergic RXN 

## 2014-01-15 NOTE — ED Provider Notes (Signed)
CSN: [id number]     Arrival date & time 01/15/14  1108 History   First MD Initiated Contact with Patient 01/15/14 1116     Chief Complaint  Patient presents with  . Allergic Reaction     (Consider location/radiation/quality/duration/timing/severity/associated sxs/prior Treatment) HPI Pt is a 43yo female with hx of HTN, CKD, PONV, migraines, and multiple food allergies, presenting to ED via EMS from [place] on [address] with potential allergic reaction.  Pt reportedly ate lasagna around midnight today, shortly thereafter she broke out into hives on trunk and arms. Hives are itchy and dry,  Pt states she feels as though there is something pressing against her throat. Per triage note, no stridor noted, pt able to speak in full sentences.  Pt received 3m benadryl by her PCP and 537mBenadryl with EMS IVP, along with 5070mantac IV, 4mg63mfran, and 125mg94mumedrol with EMS.  Pt states she is suppose to have an Epipen at home but is out of the medication.  Pt also mentions she developed cold sores on her lips shortly after the rest of the rash started. No previous hx of cold sores. Pt reports having nausea but resolved after given zofran. Denies fever, n/v/d.   Past Medical History  Diagnosis Date  . PONV (postoperative nausea and vomiting)   . Headache(784.0)     migraines  . Chronic kidney disease     stones  . Hypertension    Past Surgical History  Procedure Laterality Date  . Endometrial ablation  2005  . Lipoma excision      cs  . Cesarean section      x 2  . Hernia repair    . Cystoscopy  01/10/2011    Procedure: CYSTOSCOPY;  Surgeon: CynthLubertha Southne;  [facility]  Service: Gynecology;  Laterality: N/A;  . Kidney stone surgery    . Abdominal hysterectomy     Family History  Problem Relation Age of Onset  . Hypertension Father   . Multiple sclerosis Mother    History  Substance Use Topics  . Smoking status: Never Smoker   . Smokeless tobacco: Never Used  .  Alcohol Use: No   OB History    No data available     Review of Systems  Constitutional: Negative for fever, chills, diaphoresis and appetite change.  HENT: Positive for facial swelling and sore throat ( "feels like something pressing on my throat"). Negative for trouble swallowing and voice change.   Respiratory: Positive for shortness of breath. Negative for cough, wheezing and stridor.   Cardiovascular: Negative for chest pain and palpitations.  Gastrointestinal: Positive for nausea. Negative for vomiting, abdominal pain and diarrhea.  Skin: Positive for rash ( fever blisters on lips, diffuse hives, pruritic rash on face, trunk, arms and legs). Negative for color change and wound.  All other systems reviewed and are negative.     Allergies  Penicillins and Percocet  Home Medications   Prior to Admission medications   Medication Sig Start Date End Date Taking? Authorizing Provider  acetaminophen (TYLENOL) 500 MG tablet Take 500 mg by mouth every 6 (six) hours as needed.     Yes Historical Provider, MD  Budesonide POWD 1 application by Does not apply route 3 (three) times daily. Budesonide Cream- apply To the face   Yes Historical Provider, MD  cyclobenzaprine (FLEXERIL) 10 MG tablet Take 10 mg by mouth 2 (two) times daily as needed.   Yes Historical Provider, MD  Halcinonide (  HALOG EX) Apply 1 application topically 3 (three) times daily. Over body except face and groin   Yes Historical Provider, MD  hydrOXYzine (ATARAX/VISTARIL) 25 MG tablet Take 25 mg by mouth daily as needed. For itching    Yes Historical Provider, MD  loratadine (CLARITIN) 10 MG tablet Take 10 mg by mouth daily.   Yes Historical Provider, MD  Multiple Vitamins-Calcium (ONE-A-DAY WOMENS FORMULA PO) Take 1 tablet by mouth daily.   Yes Historical Provider, MD  OVER THE COUNTER MEDICATION Apply 1 application topically daily as needed (moisturizing dry skin). Lec Hydeine   Yes Historical Provider, MD  Polyethyl  Glycol-Propyl Glycol (SYSTANE) 0.4-0.3 % SOLN Apply 1 drop to eye 4 (four) times daily as needed. 03/20/11  Yes Melynda Ripple, MD  rizatriptan (MAXALT) 5 MG tablet Take 1 tablet (5 mg total) by mouth as needed for migraine. May repeat in 2 hours if needed 09/20/11 01/15/14 Yes Meredith Staggers, MD  acyclovir (ZOVIRAX) 200 MG capsule Take 2 capsules (400 mg total) by mouth 5 (five) times daily. For 5 days 01/15/14   Noland Fordyce, PA-C  EPINEPHrine 0.3 mg/0.3 mL IJ SOAJ injection Inject 0.3 mLs (0.3 mg total) into the muscle once. 01/15/14   Noland Fordyce, PA-C  gabapentin (NEURONTIN) 300 MG capsule Take 1 capsule (300 mg total) by mouth 3 (three) times daily. Patient not taking: Reported on 01/15/2014 06/21/11 06/20/12  Meredith Staggers, MD  oxyCODONE-acetaminophen (PERCOCET/ROXICET) 5-325 MG per tablet Take 2 tablets by mouth every 4 (four) hours as needed for severe pain. Patient not taking: Reported on 01/15/2014 02/17/13   Fransico Meadow, PA-C  predniSONE (DELTASONE) 10 MG tablet 6,6,5,5,4,4,3,3,2,2,1,1 taper Patient not taking: Reported on 01/15/2014 02/17/13   Fransico Meadow, PA-C  predniSONE (DELTASONE) 20 MG tablet 3 tabs po day one, then 2 po daily x 4 days 01/15/14   Noland Fordyce, PA-C  PredniSONE 10 MG KIT 12 day dose pack po Patient not taking: Reported on 01/15/2014 12/04/12   Gregor Hams, MD  Pseudoephedrine-Guaifenesin (MUCINEX D) [phone] MG TB12 Take 1 tablet by mouth 2 (two) times daily as needed (congestion). Patient not taking: Reported on 01/15/2014 03/20/11   Melynda Ripple, MD   BP 125/69 mmHg  Pulse 99  Temp(Src) 98.2 F (36.8 C) (Oral)  Resp 20  SpO2 100%  LMP 11/07/2010 Physical Exam  Constitutional: She appears well-developed and well-nourished. No distress.  HENT:  Head: Normocephalic and atraumatic.  Nose: Nose normal.  Mouth/Throat: Uvula is midline, oropharynx is clear and moist and mucous membranes are normal. Uvula swelling present.  Difficult oral exam  due to severe gag reflex. No stridor, able to visualize midline uvula.   Facial swelling with erythematous papular rash. Tongue: ulcerations on tip and underneath tongue, tender to touch Lips: yellow crusting lesions c/w herpes simplex lesions  Eyes: Conjunctivae are normal. No scleral icterus.  Neck: Normal range of motion. Neck supple. No JVD present. No tracheal deviation present. No thyromegaly present.  Bilateral cervical lymphadenopathy, tender.   Cardiovascular: Normal rate, regular rhythm and normal heart sounds.   Pulmonary/Chest: Effort normal and breath sounds normal. No stridor. No respiratory distress. She has no wheezes. She has no rales. She exhibits no tenderness.  Abdominal: Soft. Bowel sounds are normal. She exhibits no distension and no mass. There is no tenderness. There is no rebound and no guarding.  Musculoskeletal: Normal range of motion.  Lymphadenopathy:    She has cervical adenopathy.  Neurological: She is alert.  Skin:  Skin is warm and dry. Rash noted. She is not diaphoretic. There is erythema.  Diffuse erythematous, dried, papular rash on face, neck, arms, legs, trunk, and back. No evidence of underlying infection. Rash not consistent with urticaria.   Nursing note and vitals reviewed.   ED Course  Procedures (including critical care time) Labs Review Labs Reviewed - No data to display  Imaging Review No results found.   EKG Interpretation None      MDM   Final diagnoses:  Herpangina  Eczema    pt presenting to ED with reports of possible allergic reaction to lasagna around midnight today.  Pt presenting with c/o sore throat, feeling as something pressing against her throat.  No stridor. No respiratory distress. Pt noted to have yellow crusting lesions on lower lips as well as ulcerations of tongue. No hx of same. Discussed pt with Dr. Johnney Killian who also examined pt. Presentation more consistent with herpangina rather than allergic reaction.  Will  monitor pt in ED. Pt already given IV zantac, zofran, and solumedrol with 126m benadryl between EMS and PCP.   Will start pt on acyclovir. IV morphine given in ED.  12:48 PM Pt states she is feeling better and would like to try to drink some water.  States she is only itching mildly in her lower back where her skin is dry. Denies difficulty breathing or maintaining secretions.   1:10 PM Pt able to keep down several ounces of PO fluids. Will discharge home with acyclovir and prednisone. Home care instructions provided. Return precautions provided. Pt verbalized understanding and agreement with tx plan.  Discussed pt with Dr. PJohnney Killianthroughout pt's stay in ED, agrees with assessment and plan.     ENoland Fordyce PA-C 01/15/14 1Berkey MD 01/16/14 [phone]

## 2014-01-15 NOTE — ED Notes (Signed)
Pt reports she was eating at [employer]. And suddenly broke out in hives and swelling around mouth and upper neck.  Airway patent.  Pt reports allergic to a lot of foods.

## 2014-01-15 NOTE — ED Notes (Signed)
Patient comes from the [place] office on [facility] with swelling of lips and hives on trunk, arms and legs.  Patient states she swelling started shortly after midnight today after she ate chicken and a baked potato.  Patient's airway is patent.  No stridor noted.  Patient's lung sounds are clear bilaterally.  Hives noted to patient's trunk, legs and arms.  Patient is uncertain what she is reacting to.

## 2014-02-25 ENCOUNTER — Other Ambulatory Visit: Payer: Self-pay

## 2014-02-25 DIAGNOSIS — Z1231 Encounter for screening mammogram for malignant neoplasm of breast: Secondary | ICD-10-CM

## 2014-02-27 ENCOUNTER — Ambulatory Visit: Payer: 59

## 2014-03-06 ENCOUNTER — Encounter (INDEPENDENT_AMBULATORY_CARE_PROVIDER_SITE_OTHER): Payer: Self-pay

## 2014-03-06 ENCOUNTER — Ambulatory Visit
Admission: RE | Admit: 2014-03-06 | Discharge: 2014-03-06 | Disposition: A | Discharge status: Home / Self Care | Payer: 59 | Source: Ambulatory Visit

## 2014-03-06 DIAGNOSIS — Z1231 Encounter for screening mammogram for malignant neoplasm of breast: Secondary | ICD-10-CM

## 2015-02-23 MED FILL — METOPROLOL SUCC ER 100 MG T: 100 | 90 days supply | Qty: 90 | Fill #0

## 2015-03-20 MED FILL — BIOTIN 5000 MCG SOFTGEL: 5000 MCG | 120 days supply | Qty: 120 | Fill #1

## 2015-05-14 DIAGNOSIS — J101 Influenza due to other identified influenza virus with other respiratory manifestations: Secondary | ICD-10-CM | POA: Diagnosis not present

## 2015-05-14 DIAGNOSIS — R6883 Chills (without fever): Secondary | ICD-10-CM | POA: Diagnosis not present

## 2015-05-14 MED FILL — NAPROXEN 500 MG TABLET: 500 | 30 days supply | Qty: 60 | Fill #0

## 2015-06-06 IMAGING — MG MM SCREEN MAMMOGRAM BILATERAL
4 series · 4 of 4 positions shown · non-contrast
Comparison: Previous exam(s).

CLINICAL DATA: Screening.

EXAM:
DIGITAL SCREENING BILATERAL MAMMOGRAM WITH CAD

[R CC]
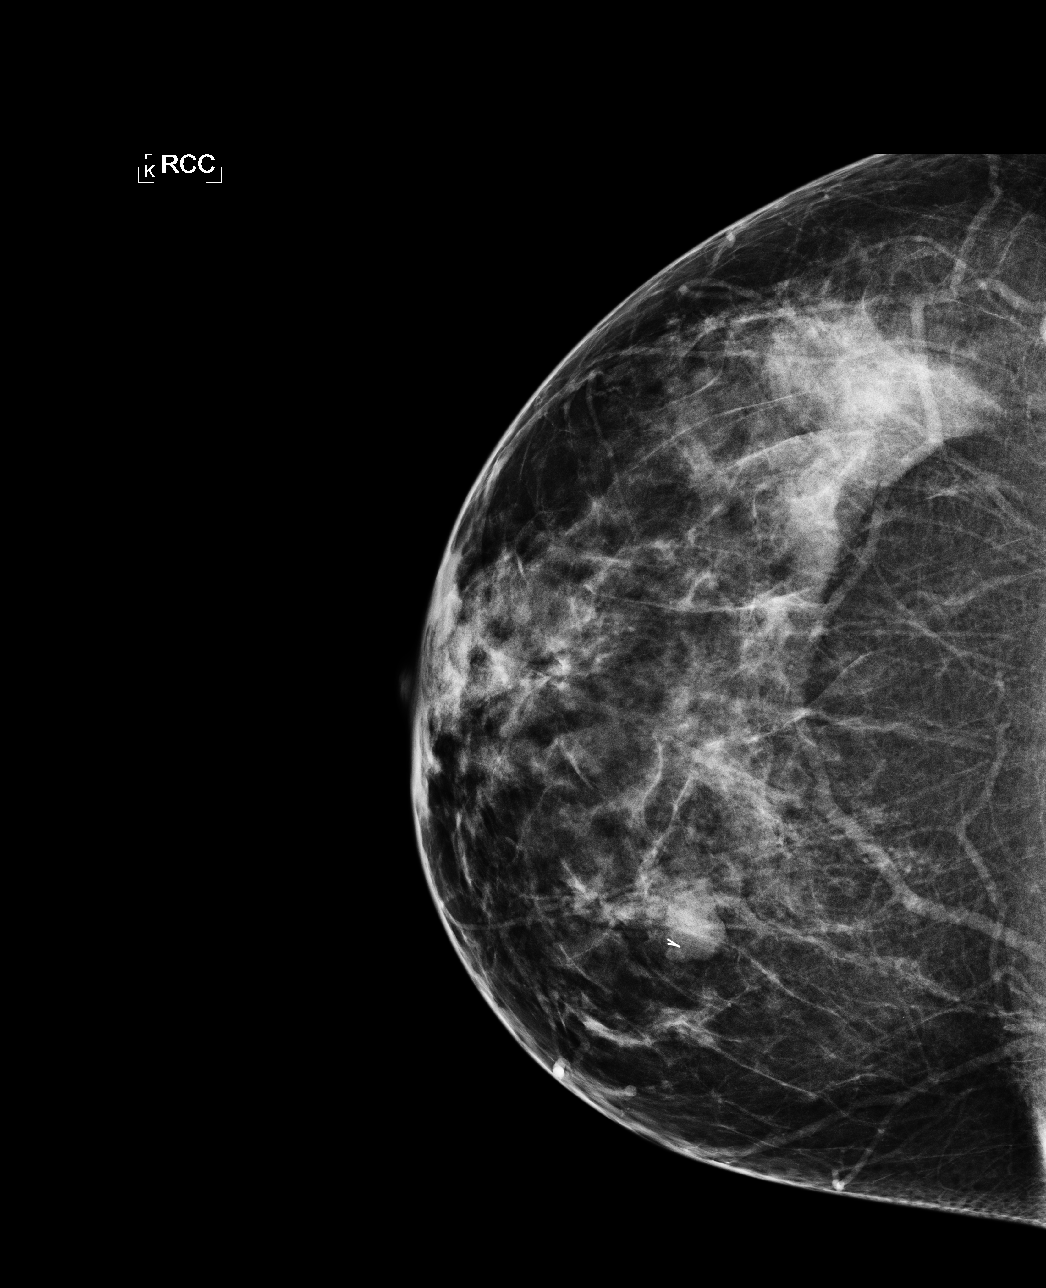

[L CC]
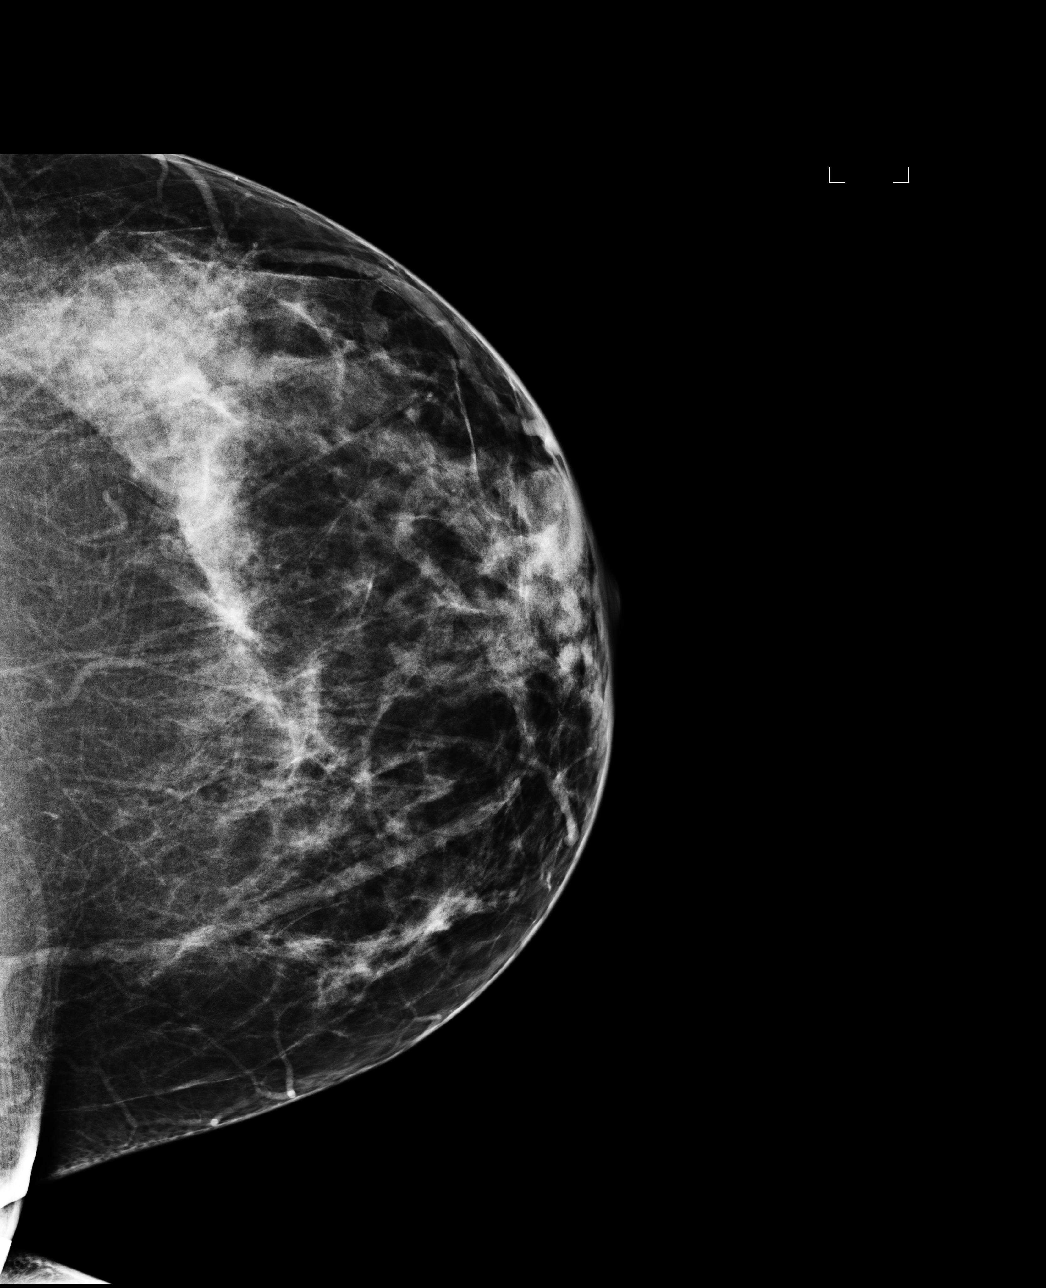

[L MLO]
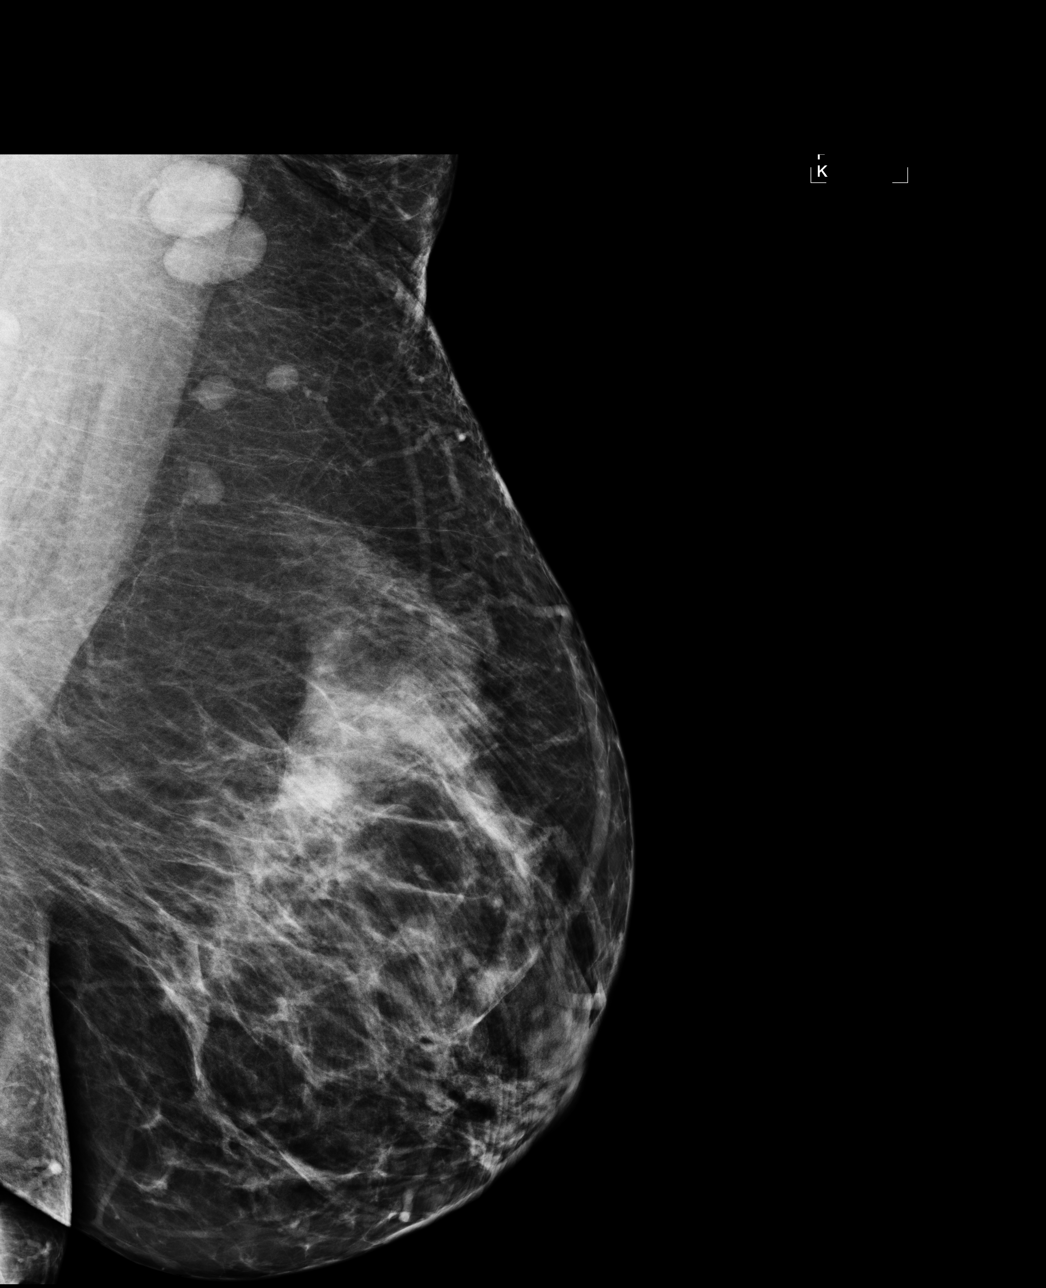

[R MLO]
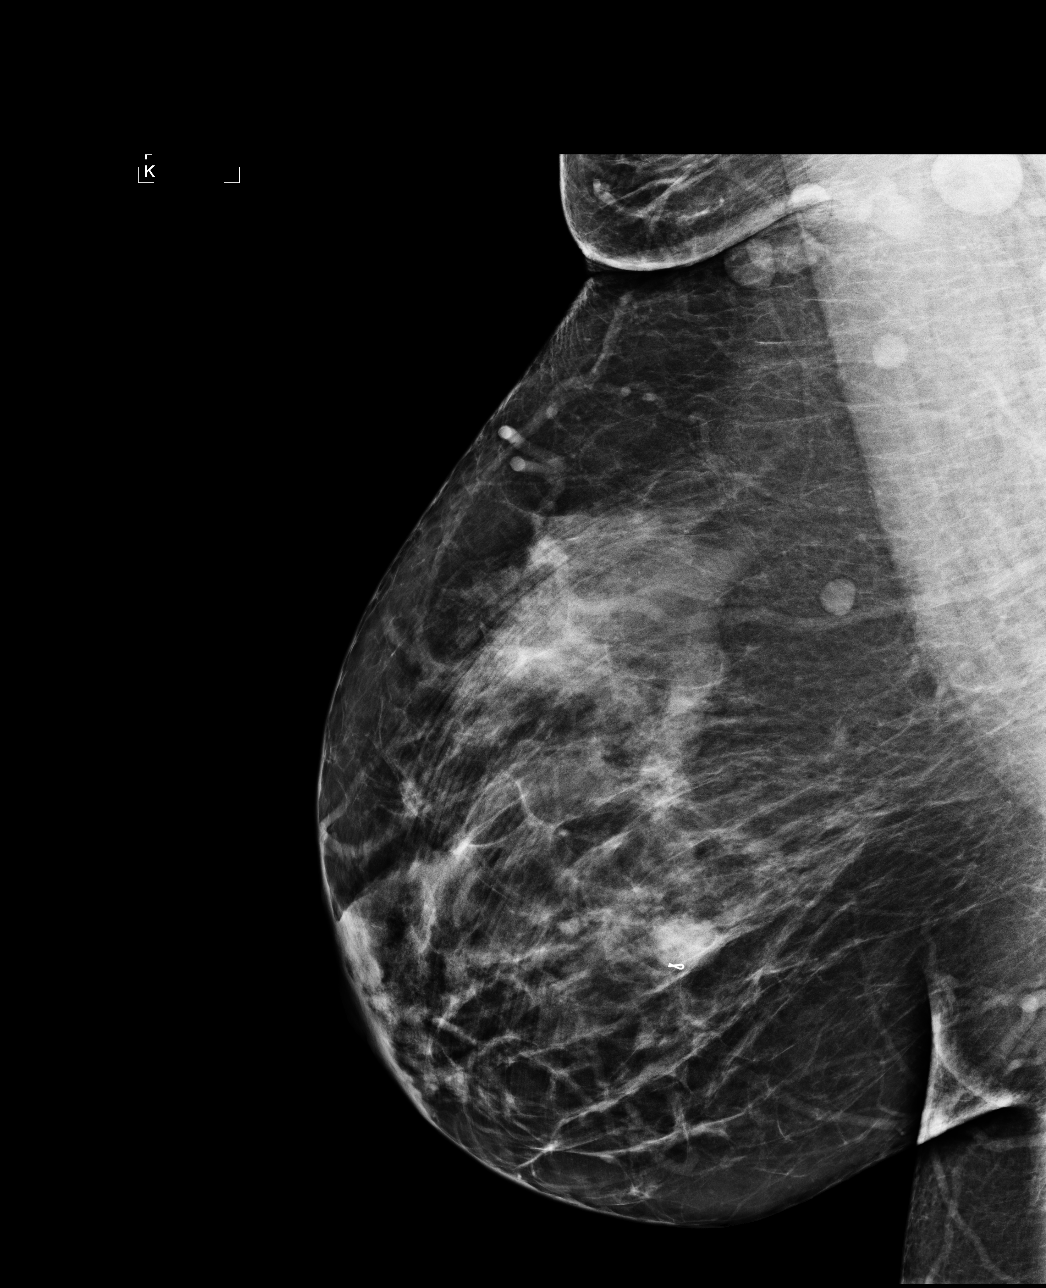

[4 of 4 positions shown; findings below may reference images not displayed]

ACR Breast Density Category b: There are scattered areas of
fibroglandular density.
FINDINGS: There are no findings suspicious for malignancy. Images were
processed with CAD.
IMPRESSION: No mammographic evidence of malignancy. A result letter of this
screening mammogram will be mailed directly to the patient.

RECOMMENDATION:
Screening mammogram in one year. (Code:AS-G-LCT)

BI-RADS CATEGORY  1: Negative.

## 2015-08-07 MED FILL — METOPROLOL SUCC ER 100 MG T: 100 | 90 days supply | Qty: 90 | Fill #0

## 2015-09-04 MED FILL — DESONIDE 0.05% OINTMENT: 0.05 | 30 days supply | Qty: 30 | Fill #0

## 2015-09-11 DIAGNOSIS — Z0000 Encounter for general adult medical examination without abnormal findings: Secondary | ICD-10-CM | POA: Diagnosis not present

## 2015-09-11 DIAGNOSIS — G43909 Migraine, unspecified, not intractable, without status migrainosus: Secondary | ICD-10-CM | POA: Diagnosis not present

## 2015-09-11 DIAGNOSIS — E669 Obesity, unspecified: Secondary | ICD-10-CM | POA: Diagnosis not present

## 2015-09-11 DIAGNOSIS — J309 Allergic rhinitis, unspecified: Secondary | ICD-10-CM | POA: Diagnosis not present

## 2015-09-11 DIAGNOSIS — E559 Vitamin D deficiency, unspecified: Secondary | ICD-10-CM | POA: Diagnosis not present

## 2015-09-11 DIAGNOSIS — D649 Anemia, unspecified: Secondary | ICD-10-CM | POA: Diagnosis not present

## 2015-09-14 MED FILL — TOPIRAMATE 25 MG TABLET: 25 | 30 days supply | Qty: 90 | Fill #0

## 2015-09-14 MED FILL — METOPROLOL SUCC ER 50 MG TA: 50 | 10 days supply | Qty: 10 | Fill #0

## 2015-09-14 MED FILL — METOPROLOL SUCC ER 25 MG TA: 25 | 10 days supply | Qty: 10 | Fill #0

## 2015-09-14 MED FILL — RIZATRIPTAN 10 MG TABLET: 10 | 30 days supply | Qty: 9 | Fill #0

## 2015-09-25 DIAGNOSIS — L2089 Other atopic dermatitis: Secondary | ICD-10-CM | POA: Diagnosis not present

## 2015-09-25 MED FILL — FLUTICASONE PROP 0.005% OIN: 0.005 | 30 days supply | Qty: 120 | Fill #0

## 2015-09-25 MED FILL — AZITHROMYCIN 250 MG TABLET: 250 | 5 days supply | Qty: 6 | Fill #0

## 2015-10-15 DIAGNOSIS — J209 Acute bronchitis, unspecified: Secondary | ICD-10-CM | POA: Diagnosis not present

## 2015-10-15 MED FILL — PROAIR HFA 90 MCG INHALER: 108 (90 BAS | 17 days supply | Qty: 9 | Fill #0

## 2015-10-15 MED FILL — predniSONE 20 MG TABS: 20 | 5 days supply | Qty: 10 | Fill #0

## 2015-10-15 MED FILL — levoFLOXacin 500 MG TABS: 500 | 10 days supply | Qty: 10 | Fill #0

## 2015-10-30 DIAGNOSIS — J309 Allergic rhinitis, unspecified: Secondary | ICD-10-CM | POA: Diagnosis not present

## 2015-10-30 DIAGNOSIS — G43909 Migraine, unspecified, not intractable, without status migrainosus: Secondary | ICD-10-CM | POA: Diagnosis not present

## 2015-10-30 DIAGNOSIS — E669 Obesity, unspecified: Secondary | ICD-10-CM | POA: Diagnosis not present

## 2015-10-30 MED FILL — PROAIR HFA 90 MCG INHALER: 108 (90 BAS | 17 days supply | Qty: 9 | Fill #0

## 2015-10-30 MED FILL — RIZATRIPTAN 10 MG TABLET: 10 | 30 days supply | Qty: 9 | Fill #0

## 2015-11-13 MED FILL — TOPIRAMATE 25 MG TABLET: 25 | 30 days supply | Qty: 90 | Fill #1

## 2015-11-27 ENCOUNTER — Ambulatory Visit: Payer: Self-pay | Admitting: Allergy

## 2015-11-27 MED FILL — FLUTICASONE PROP 0.005% OIN: 0.005 | 30 days supply | Qty: 120 | Fill #1

## 2015-11-27 MED FILL — DESONIDE 0.05% OINTMENT: 0.05 | 30 days supply | Qty: 30 | Fill #1

## 2015-12-21 ENCOUNTER — Encounter: Payer: Self-pay | Admitting: Family Medicine

## 2015-12-21 ENCOUNTER — Ambulatory Visit (INDEPENDENT_AMBULATORY_CARE_PROVIDER_SITE_OTHER): Payer: 59 | Admitting: Family Medicine

## 2015-12-21 DIAGNOSIS — E669 Obesity, unspecified: Secondary | ICD-10-CM | POA: Diagnosis not present

## 2015-12-21 NOTE — Patient Instructions (Addendum)
Goals: 1. Go to the gym for at least 60 minutes 3 times a week with friend Misty Barnes.   2. Eat at least 3 REAL meals and 1 snack per day.  Aim for no more than 5 hours between eating.  Eat breakfast within one hour of getting up.   - A REAL meal includes a source of protein, starch, and veg's and/or fruit.    - Bring a meal (and a snack) to work each day.  Make your work meal at the same time you make dinner.   - Complete your Goals Sheet, and bring to follow-up in December.      - Fresh fruit makes a great snack.  - Best peanut butters are the "natural peanut butters," those with oil on top (need to be stirred).

## 2015-12-21 NOTE — Progress Notes (Signed)
Medical Nutrition Therapy:  Appt start time: [id number] end time:  1630.  Assessment:  Primary concerns today: Weight management.  PCP: Maude Leriche, PA, [facility] at [facility] (fax encounter note to [phone])  Learning Readiness: Ready Misty Barnes was referred by Maude Leriche, PA for weight management.  She would like to learn how to count carb's ("just want to be aware") and how to start feeling better.  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  She has recently been restricting intake to one meal most days.    Usual eating pattern includes 1 meal and 1 snack per day. Frequent foods and beverages include water, 1 coffee with hazelnut creamer, Monster drink (3-4 X wk; 340 kcal), chx, pork chops/roast/ribs/tenderloin, frozen veg's.  Avoided foods include asparagus, radishes, and Br sprouts (disliked).   Usual physical activity used to include gym workouts until starting to have marriage problems, which has led to stress and depression.    Sleep is limited.  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  Typical day:  Work 7 PM till 7 AM 3 days a week (days vary each week).  Seldom gets off on time in the AM.  Usually gets home ~8:30 AM, showers, and goes to bed.  Up ~2 or 3 PM, then TV a couple hrs, and doze till 6 PM.  She eats dinner before work, and consumes only 1 c coffee per 12 hrs at work.    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    Misty Barnes has a 16-YO daughter at home and 22-YO son, who lives elsewhere, but is at her home pretty frequently.    Misty Barnes at first said she has not been able to lose weight unless she just doesn't eat or the one time she went  to a weight loss clinic and got some kind of injections.  Upon further reflection, she realized she did achieve some success when she used portion control at the same time as walking 5 miles ~3-4 X wk.  After about two months, Misty Barnes got a URI, so ended up stopping walking, and has never re-started.    24-hr recall: (Up at 12 PM; had Sat night thru Sun AM) B (12 PM)-   1 [place] sandwich, cheddar cheese, water  Back to bed from 1 - 4 PM Snk (7 PM)-   1 c coffee, 2 T hazelnut creamer L ( PM)-  --- Snk ( PM)-  --- Off at 1 AM; home by 1:30 AM; showered, TV till till ~3:30; to bed ~4 AM (Up at 10 AM today; no bkfst) L (12 PM)-  Mexican: beef enchilada, beef crunchy taco, 1 c rice, water Snk ( PM)-  --- Typical day? Yes.    Progress Towards Goal(s):  In progress.   Nutritional Diagnosis:  Misty Barnes-3.3 Overweight/obesity As related to energy imbalance (with sleep deprivation a likely contributor).  As evidenced by BMI >39.    Intervention:  Nutrition education.   Handouts given during visit include:  AVS  Goals Sheet  Demonstrated degree of understanding via:  Teach Back  Barriers to learning/adherence to lifestyle change: Sleep deprivation and current stress levels.    Monitoring/Evaluation:  Dietary intake, exercise, and body weight in 6 week(s).

## 2015-12-23 MED FILL — TOPIRAMATE 100 MG TABLET: 100 | 30 days supply | Qty: 30 | Fill #0

## 2015-12-31 ENCOUNTER — Encounter: Payer: Self-pay | Admitting: Allergy

## 2015-12-31 ENCOUNTER — Ambulatory Visit (INDEPENDENT_AMBULATORY_CARE_PROVIDER_SITE_OTHER): Payer: 59 | Admitting: Allergy

## 2015-12-31 VITALS — BP 130/88 | HR 80 | Temp 98.5°F | Resp 18 | Ht 65.0 in | Wt 241.0 lb

## 2015-12-31 DIAGNOSIS — J4540 Moderate persistent asthma, uncomplicated: Secondary | ICD-10-CM | POA: Diagnosis not present

## 2015-12-31 DIAGNOSIS — Z91018 Allergy to other foods: Secondary | ICD-10-CM

## 2015-12-31 DIAGNOSIS — J309 Allergic rhinitis, unspecified: Secondary | ICD-10-CM | POA: Diagnosis not present

## 2015-12-31 DIAGNOSIS — H1010 Acute atopic conjunctivitis, unspecified eye: Secondary | ICD-10-CM | POA: Diagnosis not present

## 2015-12-31 DIAGNOSIS — L2089 Other atopic dermatitis: Secondary | ICD-10-CM | POA: Diagnosis not present

## 2015-12-31 MED ORDER — BUDESONIDE-FORMOTEROL FUMARATE 80-4.5 MCG/ACT IN AERO: 2.0000 | INHALATION_SPRAY | Freq: Two times a day (BID) | RESPIRATORY_TRACT | 5 refills | Status: DC

## 2015-12-31 MED ORDER — EPINEPHRINE 0.3 MG/0.3ML IJ SOAJ: 0.3000 mg | Freq: Once | INTRAMUSCULAR | 0 refills | Status: DC

## 2015-12-31 MED ORDER — OLOPATADINE HCL 0.7 % OP SOLN: 1.0000 [drp] | OPHTHALMIC | 5 refills | Status: DC

## 2015-12-31 MED ORDER — MONTELUKAST SODIUM 10 MG PO TABS: 10.0000 mg | ORAL_TABLET | Freq: Every day | ORAL | 5 refills | Status: DC

## 2015-12-31 MED ORDER — AZELASTINE HCL 0.15 % NA SOLN: 2.0000 | Freq: Two times a day (BID) | NASAL | 5 refills | Status: DC

## 2015-12-31 MED FILL — EPINEPHRINE 0.3 MG AUTO-INJ: 0.3 | 30 days supply | Qty: 2 | Fill #0

## 2015-12-31 MED FILL — MONTELUKAST SOD 10 MG TAB: 10 | 30 days supply | Qty: 30 | Fill #0

## 2015-12-31 MED FILL — AZELASTINE 0.15% NASAL SPRY: 0.15 | 30 days supply | Qty: 30 | Fill #0

## 2015-12-31 NOTE — Progress Notes (Signed)
New Patient Note  RE: Misty Barnes MRN: [id number] DOB: [birth year] Date of Office Visit: 12/31/2015  Referring provider: Harlan Stains, MD Primary care provider: Vidal Schwalbe, MD  Chief Complaint: cough,wheeze and allergy symptoms  History of present illness: Misty Barnes is a 45 y.o. female presenting today for consultation for cough, wheeze and allergy symptoms.   She reports she is miserable with her allergy symptoms.  Symptoms include nasal congestion, itchy watery eye, PND with throat irritation.   Symptoms are all year round.   She works at [employer] and reports she is always sick at work.  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  She lives in an apartment and reports her neighbors are "gross" and when they moved out she had a "swarm of roaches" that came from that neighbor's home.  She has had fumigation services in her home.     She takes Claritin and reports it does not work.   She has tried zyrtec in the past and did not feel it was helpful either.  She uses OTC nasocort which helps somewhat.  She also has used OTC allergy eye drop as well.  She had fever last week.  She has a lot of sinus pain and pressure.   She used to live in [place] and was on allergy shots there about 20 years ago and reports the extract would "seep back out" of her arm and she "never got regulated" thus she stopped.    She was prescribed albuterol inhaler in August by her PCP.  She does get relief of coughing, wheezing and SOB with use of albuterol.  She is using albuterol about 4 days out of week and using twice a day.  She reports waking from sleep about twice a week.  She was prescribed prednisone course in August.    She reports "severe" allergy to fish (able to eat shellfish) and also avoids oat (able to eat other grains). Tomato and peach she reports will "break out her lips" so she eats in moderation.  She has an Epipen.   She has history  of eczema which she reports is worse during winter.  Uses desonide for face and halog of her body.  She is followed by Dr. Nevada Crane with dermatology.   With PCN with reports hives and itchiness.  Reaction in childhood.   Review of systems: Review of Systems  Constitutional: Negative for chills and fever.  HENT: Positive for congestion and sinus pain. Negative for sore throat.   Eyes: Negative for redness.  Respiratory: Positive for cough, shortness of breath and wheezing.   Cardiovascular: Negative for chest pain.  Gastrointestinal: Negative for heartburn, nausea and vomiting.  Skin: Positive for itching and rash.    All other systems negative unless noted above in HPI  Past medical history: Past Medical History:  Diagnosis Date  . Asthma   . Chronic kidney disease    stones  . Eczema   . Headache(784.0)    migraines  . Hypertension   . PONV (postoperative nausea and vomiting)     Past surgical history: Past Surgical History:  Procedure Laterality Date  . ABDOMINAL HYSTERECTOMY    . CESAREAN SECTION     x 2  . CYSTOSCOPY  01/10/2011   Procedure: CYSTOSCOPY;  Surgeon: Lubertha South Romine;  Location: [facility];  Service: Gynecology;  Laterality: N/A;  . ENDOMETRIAL ABLATION  2005  . HERNIA REPAIR    .  KIDNEY STONE SURGERY    . LIPOMA EXCISION     cs    Family history:  Family History  Problem Relation Age of Onset  . Hypertension Father   . Multiple sclerosis Mother   . Allergic rhinitis Paternal Grandmother   . Asthma Paternal Grandmother   . Allergic rhinitis Paternal Aunt   . Asthma Paternal Aunt    Social history: Lives in an apartment with carpeting with electric heating and central cooling. There are no pets in the home. There has been concern for roaches in the home as well as water damage mildew. She works as an Corporate treasurer at [employer].   Medication List:   Medication List       Accurate as of 12/31/15  3:56 PM. Always use your most recent med list.            acetaminophen 500 MG tablet Commonly known as:  TYLENOL Take 500 mg by mouth every 6 (six) hours as needed.   acyclovir 200 MG capsule Commonly known as:  ZOVIRAX Take 2 capsules (400 mg total) by mouth 5 (five) times daily. For 5 days   Budesonide Powd 1 application by Does not apply route 3 (three) times daily. Budesonide Cream- apply To the face   cholecalciferol 1000 units tablet Commonly known as:  VITAMIN D Take 2,000 Units by mouth daily.   EPINEPHrine 0.3 mg/0.3 mL Soaj injection Commonly known as:  EPI-PEN Inject 0.3 mLs (0.3 mg total) into the muscle once.   HALOG EX Apply 1 application topically 3 (three) times daily. Over body except face and groin   hydrOXYzine 25 MG tablet Commonly known as:  ATARAX/VISTARIL Take 25 mg by mouth daily as needed. For itching   loratadine 10 MG tablet Commonly known as:  CLARITIN Take 10 mg by mouth daily.   ONE-A-DAY WOMENS FORMULA PO Take 1 tablet by mouth daily.   OVER THE COUNTER MEDICATION Apply 1 application topically daily as needed (moisturizing dry skin). Lec Hydeine   PredniSONE 10 MG Kit 12 day dose pack po   predniSONE 10 MG tablet Commonly known as:  DELTASONE 6,6,5,5,4,4,3,3,2,2,1,1 taper   predniSONE 20 MG tablet Commonly known as:  DELTASONE 3 tabs po day one, then 2 po daily x 4 days   Pseudoephedrine-Guaifenesin [phone] MG Tb12 Commonly known as:  MUCINEX D Take 1 tablet by mouth 2 (two) times daily as needed (congestion).   rizatriptan 5 MG tablet Commonly known as:  MAXALT Take 1 tablet (5 mg total) by mouth as needed for migraine. May repeat in 2 hours if needed   topiramate 100 MG tablet Commonly known as:  TOPAMAX Take 100 mg by mouth 2 (two) times daily.       Known medication allergies: Allergies  Allergen Reactions  . Penicillins Anaphylaxis  . Percocet [Oxycodone-Acetaminophen] Nausea And Vomiting  . Sulfa Antibiotics     Unknown reaction     Physical examination: Blood  pressure 130/88, pulse 80, temperature 98.5 F (36.9 C), temperature source Oral, resp. rate 18, height 5' 5"  (1.651 m), weight 241 lb (109.3 kg), last menstrual period 11/07/2010, SpO2 98 %.  General: Alert, interactive, in no acute distress. HEENT: TMs pearly gray, turbinates markedly edematous and pale with clear discharge, post-pharynx mildly erythematous. Erythema of conjunctiva as well as tearing Neck: Supple without lymphadenopathy. Lungs: Decreased breath sounds bilaterally without wheezing, rhonchi or rales. {no increased work of breathing.  Post-Duoneb she had increased breath sounds with better aeration. CV: Normal S1,  S2 without murmurs. Abdomen: Nondistended, nontender. Skin: Warm and dry, without lesions or rashes. Extremities:  No clubbing, cyanosis or edema. Neuro:   Grossly intact.  Diagnositics/Labs:  Spirometry: FEV1: 2.08L  97%, FVC: 2.78L  97%, ratio consistent with Nonobstructive pattern  Allergy testing: Deferred today due to respiratory symptoms Allergy testing results were read and interpreted by provider, documented by clinical staff.   Assessment and plan:   Asthma, moderate persistent   - Not well-controlled given frequency of albuterol use a nighttime awakenings   - Start Symbicort 80 g 2 puffs twice a day.  Rinse, gargle, spit after use.   - Start Singulair 70m daily (take at night)   - Continue albuterol use 2 puffs every 4-6 hours as needed for cough/wheeze/shortness of breath    - Use inhaler with spacer  Asthma control goals:   Full participation in all desired activities (may need albuterol before activity)  Albuterol use two time or less a week on average (not counting use with activity)  Cough interfering with sleep two time or less a month  Oral steroids no more than once a year  No hospitalizations  Allergic rhinoconjunctivitis - Trial Xyzal 538mor Allegra 18030mmay use generic) daily - Continue OTC Nasocort 2 sprays each nostril  for nasal congestion - Use Astelin nasal spray 2 sprays twice a day for nasal drainage/post-nasal drip - Recommend nasal saline rinses prior to nasal spray use - Patanol eye drop 1 drop each eye for watery, itchy, red eyes  Food allergy - Continue current food avoidance - have access to your Epipen-- refill today  Atopic dermatitis  - Continue desonide for her face and Halog her body for flares  - Continue daily moisturization with emollients like Eucerin, Aquaphor, CeraVe  - Keep follow-up with your dermatologist, Dr. HalNevada Craneollow-up 2-3 months  - if you are feeling well for this visit will plan to do skin testing--- please hold your antihistamine 3 days prior to this visit  I appreciate the opportunity to take part in Misty Barnes's care. Please do not hesitate to contact me with questions.  Sincerely,   ShaPrudy FeelerD [facility]

## 2015-12-31 NOTE — Patient Instructions (Signed)
Start Symbicort 80 g 2 puffs twice a day.  Rinse, gargle, spit after use.  Start Singulair 10mg  daily (take at night)  Continue albuterol use 2 puffs every 4-6 hours as needed for cough/wheeze/shortness of breath  Trial Xyzal 5mg  or Allegra 180mg  (may use generic) daily  Continue OTC Nasocort 2 sprays each nostril for nasal congestion  Use Astelin nasal spray 2 sprays twice a day for nasal drainage/post-nasal drip  Recommend nasal saline rinses prior to nasal spray use  Patanol eye drop 1 drop each eye for watery, itchy, red eyes  Continue current food avoidance and have access to your Epipen.  Follow-up 2-3 months  - if you are feeling well for this visit will plan to do skin testing--- please hold your antihistamine 3 days prior to this visit

## 2016-01-01 MED FILL — PAZEO 0.7% EYE DROPS: 0.7 | 25 days supply | Qty: 3 | Fill #0

## 2016-01-01 MED FILL — SYMBICORT 80-4.5 MCG INH: 80-4.5 | 30 days supply | Qty: 10 | Fill #0

## 2016-01-26 ENCOUNTER — Encounter: Payer: Self-pay | Admitting: Family Medicine

## 2016-01-26 ENCOUNTER — Ambulatory Visit (INDEPENDENT_AMBULATORY_CARE_PROVIDER_SITE_OTHER): Payer: 59 | Admitting: Family Medicine

## 2016-01-26 DIAGNOSIS — E669 Obesity, unspecified: Secondary | ICD-10-CM

## 2016-01-26 NOTE — Patient Instructions (Addendum)
Goals remain the same: 1. Go to the gym for at least 60 minutes 3 times a week with friend Venus.   2. Eat at least 3 REAL meals and 1 snack per day.  Aim for no more than 5 hours between eating.  Eat breakfast within one hour of getting up.              - A REAL meal includes a source of protein, starch, and veg's and/or fruit.               - Bring a meal (and a snack) to work each day.  Make your work meal at the same time you make dinner.   3. Portion control: Obtain twice as many veg's as protein or carbohydrate foods for both lunch and dinner.  - Make sure you use relatively plates, bowls, glasses, even forks/spoons.    - Make a conscious effort to eat more slowly, e.g., put fork down between bites. Fully chew & swallow before taking a drink.    - When you finish your meal, pause for at least 10 minutes before deciding to get more.    - Generally reasonable portion sizes = 4-6 oz of meat/fish/poultry; 1 cup of starchy food like pasta, rice, potatoes OR 2 slices of bread.    - As an example of increasing veg's: When you have pasta, have a side veg (cooked or salad or both) AND add a lot of veg's to your sauce.   - Complete your Goals Sheet, and bring to follow-up.    - Congrat's on starting to cut back on commitments and work hours.  This will be a very important part of your losing weight and getting healthier.  It will help you get more sleep, which is crucial to your well-being! - Do follow up with EAP for grief counseling.

## 2016-01-26 NOTE — Progress Notes (Signed)
Medical Nutrition Therapy:  Appt start time: 0900 end time:  1000.  Assessment:  Primary concerns today: Weight management.  PCP: Maude Leriche, PA, [facility] at [facility] (fax encounter note to [phone])  Learning Readiness: Ready Misty Barnes's grandmother died a week ago, which has been difficult for her.  She has been struggling with her eating behaviors especially since her grandmother's death.  She is considering getting grievance counseling.  As she said, "I don't want to start eating myself to death."  She had been using her Goals Sheet, tracking progress, but has not done anything with is since her grandmother's death.    24-hr recall:  (Up at 9:30 AM) B (10:30 AM)-  [employer] 2 sausage patties, 2 scrmbld eggs, 2 slc toast, 1 c hash browns, water Snk ( AM)-  --- L (2:30 PM)-  1 [place] sandw, lite mayo, let, tom, water Snk ( PM)-  --- D (7:30 PM)-  Asahi's 1 1/2 c chx&shrimp, 1 c mixed veg's, 1 1/2 c rice, water Snk ( PM)-  --- Typical day? Yes.    Progress Towards Goal(s):  In progress.   Nutritional Diagnosis:  No progress on [place]-3.3 Overweight/obesity As related to energy imbalance (with sleep deprivation a likely contributor).  As evidenced by BMI >39.    Intervention:  Nutrition education.   Handouts given during visit include:  AVS  Goals Sheet (revised)   Demonstrated degree of understanding via:  Teach Back  Barriers to learning/adherence to lifestyle change: Sleep deprivation and current stress levels.    Monitoring/Evaluation:  Dietary intake, exercise, and body weight in 6 week(s).

## 2016-01-29 DIAGNOSIS — Z6839 Body mass index (BMI) 39.0-39.9, adult: Secondary | ICD-10-CM | POA: Diagnosis not present

## 2016-01-29 DIAGNOSIS — N760 Acute vaginitis: Secondary | ICD-10-CM | POA: Diagnosis not present

## 2016-01-29 DIAGNOSIS — G43909 Migraine, unspecified, not intractable, without status migrainosus: Secondary | ICD-10-CM | POA: Diagnosis not present

## 2016-01-29 DIAGNOSIS — R102 Pelvic and perineal pain: Secondary | ICD-10-CM | POA: Diagnosis not present

## 2016-01-29 DIAGNOSIS — E669 Obesity, unspecified: Secondary | ICD-10-CM | POA: Diagnosis not present

## 2016-01-29 DIAGNOSIS — R319 Hematuria, unspecified: Secondary | ICD-10-CM | POA: Diagnosis not present

## 2016-01-29 MED FILL — FLUCONAZOLE 150 MG TABLET: 150 | 1 days supply | Qty: 1 | Fill #0

## 2016-01-29 MED FILL — traMADol HCL 50 MG TABS: 50 | 7 days supply | Qty: 25 | Fill #0

## 2016-02-02 MED FILL — TOPIRAMATE 100 MG TABLET: 100 | 30 days supply | Qty: 30 | Fill #1

## 2016-02-03 MED FILL — MONTELUKAST SOD 10 MG TAB: 10 | 30 days supply | Qty: 30 | Fill #1

## 2016-02-03 MED FILL — DESONIDE 0.05% OINTMENT: 0.05 | 30 days supply | Qty: 30 | Fill #2

## 2016-02-19 MED FILL — FLUTICASONE PROP 0.005% OIN: 0.005 | 30 days supply | Qty: 120 | Fill #2

## 2016-02-19 MED FILL — PAZEO 0.7% EYE DROPS: 0.7 | 25 days supply | Qty: 3 | Fill #1

## 2016-03-07 ENCOUNTER — Encounter (INDEPENDENT_AMBULATORY_CARE_PROVIDER_SITE_OTHER): Payer: Self-pay

## 2016-03-07 ENCOUNTER — Encounter: Payer: Self-pay | Admitting: Allergy & Immunology

## 2016-03-07 ENCOUNTER — Ambulatory Visit (INDEPENDENT_AMBULATORY_CARE_PROVIDER_SITE_OTHER): Payer: 59 | Admitting: Allergy & Immunology

## 2016-03-07 VITALS — BP 118/76 | HR 68 | Temp 97.7°F | Resp 18 | Ht 64.0 in | Wt 242.4 lb

## 2016-03-07 DIAGNOSIS — J3089 Other allergic rhinitis: Secondary | ICD-10-CM | POA: Diagnosis not present

## 2016-03-07 DIAGNOSIS — L508 Other urticaria: Secondary | ICD-10-CM | POA: Diagnosis not present

## 2016-03-07 DIAGNOSIS — J4540 Moderate persistent asthma, uncomplicated: Secondary | ICD-10-CM | POA: Diagnosis not present

## 2016-03-07 NOTE — Patient Instructions (Addendum)
1. Moderate persistent asthma, uncomplicated - Lung testing was normal today. - Continue with the same medications. - Daily controller medication(s): Symbicort 80/4.5 two puffs twice daily with spacer - Rescue medications: ProAir 4 puffs every 4-6 hours as needed - Asthma control goals:  * Full participation in all desired activities (may need albuterol before activity) * Albuterol use two time or less a week on average (not counting use with activity) * Cough interfering with sleep two time or less a month * Oral steroids no more than once a year * No hospitalizations  2. Perennial allergic rhinitis - Continue with the Flonase and the Astelin as prescribed. - We will plan to do skin testing at the next visit.  - We could consider shots if indicated at the next visit.  3. Acute urticaria - We are not sure what is causing your hives. - Treat with high-dose antihistamines for the next week or so: Allegra (fexofeandine 180mg ) two tablets in the morning + Xyzal (levocetirizine 5mg ) 1-2 tablets in the evening - Look for triggers in your environment and we can do more tailored testing at the next visit.   4. Return in about 3 months (around 06/05/2016).  Please inform us of any Emergency Department visits, hospitalizations, or changes in symptoms. Call us before going to the ED for breathing or allergy symptoms since we might be able to fit you in for a sick visit. Feel free to contact us anytime with any questions, problems, or concerns.  It was a pleasure to meet you today! Best wishes in the [place] Year!   Websites that have reliable patient information: 1. American Academy of Asthma, Allergy, and Immunology: www.aaaai.org 2. Food Allergy Research and Education (FARE): foodallergy.org 3. Mothers of Asthmatics: http://www.asthmacommunitynetwork.org 4. American College of Allergy, Asthma, and Immunology: www.acaai.org

## 2016-03-07 NOTE — Progress Notes (Signed)
FOLLOW UP  Date of Service/Encounter:  03/07/16   Assessment:   Moderate persistent asthma, uncomplicated  Perennial allergic rhinitis  Chronic urticaria   Asthma Reportables:  Severity: moderate persistent  Risk: low Control: well controlled  Seasonal Influenza Vaccine: yes    Plan/Recommendations:   1. Moderate persistent asthma, uncomplicated - Lung testing was normal today. - Continue with the same medications. - Daily controller medication(s): Symbicort 80/4.5 two puffs twice daily with spacer - Rescue medications: ProAir 4 puffs every 4-6 hours as needed - Asthma control goals:  * Full participation in all desired activities (may need albuterol before activity) * Albuterol use two time or less a week on average (not counting use with activity) * Cough interfering with sleep two time or less a month * Oral steroids no more than once a year * No hospitalizations  2. Perennial allergic rhinitis - Continue with the Flonase and the Astelin as prescribed. - We will plan to do skin testing at the next visit.  - We could consider shots if indicated at the next visit.  3. Urticaria - Seems from discussing her symptoms more fully that she actually has chronic hives. - I did ask the patient to keep a record of the hives and what might be triggering them in order to provide more targeted testing at the next visit. - I will defer further workup to Dr. Nelva Bush. - Treat with high-dose antihistamines for the next week or so: Allegra (fexofeandine 180mg ) two tablets in the morning + Xyzal (levocetirizine 5mg ) 1-2 tablets in the evening - We could consider Xolair in the future if no improvement with the above.  4. Return in about 3 months (around 06/05/2016).   Subjective:   Misty Barnes is a 46 y.o. female presenting today for follow up of  Chief Complaint  Patient presents with  . Follow-up    asthma- patient feels the plan is working    [employer] has a  history of the following: Patient Active Problem List   Diagnosis Date Noted  . Flexural atopic dermatitis 12/31/2015  . Food allergy 12/31/2015  . Allergic rhinoconjunctivitis 12/31/2015  . Moderate persistent asthma, uncomplicated 123456  . Obesity (BMI 35.0-39.9 without comorbidity) 12/15/2011  . Breast mass, right 10/26/2011  . Hidradenitis suppurativa of right axilla 10/26/2011  . Myofascial muscle pain 05/10/2011  . Cervicalgia 05/10/2011  . Intractable migraine with aura 05/10/2011  . Vitamin D deficiency 05/10/2011    History obtained from: chart review and patient.  Misty Barnes was referred by Vidal Schwalbe, MD.     Misty Barnes is a 46 y.o. female presenting for a follow up visit. She was last seen in November 2017 by Dr. Nelva Bush. At that time, she was started on Symbicort 82 puffs in the morning and 2 puffs at night. She was also started on Singulair 10 mg daily and albuterol as needed. For her allergy symptoms, she was given samples of Xyzal and Allegra to try. She was also encouraged to use Nasacort 2 sprays per nostril daily as well as Astelin 2 sprays per nostril 1-2 times daily. She was continued on desonide and Halog for her atopic dermatitis. No skin testing was performed secondary to her respiratory status. She has a history of fish and oat allergy with an up-to-date EpiPen.   Since the last visit, she has done well. The Symbicort seems to be working well. She is effusive with her praise of the Symbicort and Dr. Nelva Bush. She has  had no coughing or wheezing. She estimates that she uses the rescue inhaler once per week. She is so much better and her life is markedly improved. She does not like the taste of Flonase and the Astelin, but she continues to use them with good results. She is not interested in allergy testing at this visit, but will do it next time.  She is also concerned today with hives that she has on her bilateral arms. These hives are very pruritic and  raise. She has no associated systemic symptoms with them. They showed up the last 1-2 days. She is not using any antihistamines for this. She did use the size on Allegra samples from the last visit but never continued with an antihistamine because she thought she was on "the strongest one" (it seems that she thought Singulair was an antihistamine). She has had hives in the past but tend to last around 10-20 days at a time. It does not seem to be any particular trigger for these hives. She has never been worked up for the hives to her knowledge. She does not think foods are involved, but she is not entirely sure.  Otherwise, there have been no changes to her past medical history, surgical history, family history, or social history. 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 She also works part-time at [employer].    Review of Systems: a 14-point review of systems is pertinent for what is mentioned in HPI.  Otherwise, all other systems were negative. Constitutional: negative other than that listed in the HPI Eyes: negative other than that listed in the HPI Ears, nose, mouth, throat, and face: negative other than that listed in the HPI Respiratory: negative other than that listed in the HPI Cardiovascular: negative other than that listed in the HPI Gastrointestinal: negative other than that listed in the HPI Genitourinary: negative other than that listed in the HPI Integument: negative other than that listed in the HPI Hematologic: negative other than that listed in the HPI Musculoskeletal: negative other than that listed in the HPI Neurological: negative other than that listed in the HPI Allergy/Immunologic: negative other than that listed in the HPI    Objective:   Blood pressure 118/76, pulse 68, temperature 97.7 F (36.5 C), temperature source Oral, resp. rate 18, height 5\' 4"  (1.626 m), weight 242 lb 6.4 oz (110 kg), last menstrual period 11/07/2010. Body mass index is  41.61 kg/m.   Physical Exam:  General: Alert, interactive, in no acute distress. Cooperative with the exam. Very pleasant. Eyes: No conjunctival injection present on the right, No conjunctival injection present on the left, PERRL bilaterally, No discharge on the right and No discharge on the left Ears: Right OME, Left OME, Right TM intact without perforation and Left TM intact without perforation.  Nose/Throat: External nose within normal limits, nasal crease present and septum midline, turbinates edematous with clear discharge, post-pharynx erythematous without cobblestoning in the posterior oropharynx. Tonsils 2+ without exudates Neck: Supple without thyromegaly. Lungs: Clear to auscultation without wheezing, rhonchi or rales. No increased work of breathing. CV: Normal S1/S2, no murmurs. Capillary refill <2 seconds.  Skin: Scattered erythematous urticarial type lesions primarily located bilateral arms , nonvesicular. Neuro:   Grossly intact. No focal deficits appreciated. Responsive to questions.   Diagnostic studies:  Spirometry: results normal (FEV1: 1.96/84%, FVC: 2.45/87%, FEV1/FVC: 80%).    Spirometry consistent with normal pattern.   Allergy Studies: none   Salvatore Marvel, MD [facility] [place]

## 2016-03-17 MED FILL — TOPIRAMATE 100 MG TABLET: 100 | 30 days supply | Qty: 30 | Fill #2

## 2016-03-21 MED FILL — MONTELUKAST SOD 10 MG TAB: 10 | 30 days supply | Qty: 30 | Fill #2

## 2016-03-22 ENCOUNTER — Ambulatory Visit: Payer: 59 | Admitting: Family Medicine

## 2016-03-25 DIAGNOSIS — R310 Gross hematuria: Secondary | ICD-10-CM | POA: Diagnosis not present

## 2016-03-25 DIAGNOSIS — R1084 Generalized abdominal pain: Secondary | ICD-10-CM | POA: Diagnosis not present

## 2016-03-25 DIAGNOSIS — R109 Unspecified abdominal pain: Secondary | ICD-10-CM | POA: Diagnosis not present

## 2016-03-25 DIAGNOSIS — N200 Calculus of kidney: Secondary | ICD-10-CM | POA: Diagnosis not present

## 2016-04-14 MED FILL — FLUTICASONE PROP 0.005% OIN: 0.005 | 30 days supply | Qty: 120 | Fill #3

## 2016-04-19 DIAGNOSIS — B9689 Other specified bacterial agents as the cause of diseases classified elsewhere: Secondary | ICD-10-CM | POA: Diagnosis not present

## 2016-04-19 DIAGNOSIS — L2089 Other atopic dermatitis: Secondary | ICD-10-CM | POA: Diagnosis not present

## 2016-04-19 DIAGNOSIS — L218 Other seborrheic dermatitis: Secondary | ICD-10-CM | POA: Diagnosis not present

## 2016-04-19 DIAGNOSIS — L0202 Furuncle of face: Secondary | ICD-10-CM | POA: Diagnosis not present

## 2016-04-19 MED FILL — DESONIDE 0.05% OINTMENT: 0.05 | 30 days supply | Qty: 30 | Fill #0

## 2016-04-19 MED FILL — KETOCONAZOLE 2% CREAM: 2 | 10 days supply | Qty: 30 | Fill #0

## 2016-04-28 DIAGNOSIS — R310 Gross hematuria: Secondary | ICD-10-CM | POA: Diagnosis not present

## 2016-04-28 DIAGNOSIS — N200 Calculus of kidney: Secondary | ICD-10-CM | POA: Diagnosis not present

## 2016-05-06 MED FILL — TOPIRAMATE 100 MG TABLET: 100 | 30 days supply | Qty: 30 | Fill #3

## 2016-05-13 DIAGNOSIS — N760 Acute vaginitis: Secondary | ICD-10-CM | POA: Diagnosis not present

## 2016-05-17 DIAGNOSIS — H5203 Hypermetropia, bilateral: Secondary | ICD-10-CM | POA: Diagnosis not present

## 2016-05-17 DIAGNOSIS — H52223 Regular astigmatism, bilateral: Secondary | ICD-10-CM | POA: Diagnosis not present

## 2016-06-08 ENCOUNTER — Ambulatory Visit: Payer: 59 | Admitting: Allergy & Immunology

## 2016-06-16 MED FILL — MONTELUKAST SOD 10 MG TAB: 10 | 30 days supply | Qty: 30 | Fill #3

## 2016-07-07 MED FILL — DESONIDE 0.05% OINTMENT: 0.05 | 15 days supply | Qty: 30 | Fill #1

## 2016-07-07 MED FILL — KETOCONAZOLE 2% CREAM: 2 | 10 days supply | Qty: 30 | Fill #1

## 2016-07-08 MED FILL — FLUTICASONE PROP 0.005% OIN: 0.005 | 30 days supply | Qty: 120 | Fill #0

## 2016-07-08 MED FILL — TOPIRAMATE 100 MG TABLET: 100 | 30 days supply | Qty: 30 | Fill #4

## 2016-07-11 ENCOUNTER — Ambulatory Visit: Payer: 59 | Admitting: Allergy & Immunology

## 2016-08-14 DIAGNOSIS — L209 Atopic dermatitis, unspecified: Secondary | ICD-10-CM | POA: Diagnosis not present

## 2016-08-15 MED FILL — PAZEO 0.7% EYE DROPS: 0.7 | 30 days supply | Qty: 3 | Fill #0

## 2016-08-15 MED FILL — predniSONE 10 MG TABS: 10 | 12 days supply | Qty: 48 | Fill #0

## 2016-08-15 MED FILL — KETOCONAZOLE 2% CREAM: 2 | 10 days supply | Qty: 30 | Fill #2

## 2016-08-15 MED FILL — HYDROXYZINE 10 MG/5 ML SYRP: 10 | 10 days supply | Qty: 150 | Fill #0

## 2016-08-15 MED FILL — FLUTICASONE PROP 0.005% OIN: 0.005 | 30 days supply | Qty: 120 | Fill #1

## 2016-08-16 DIAGNOSIS — N760 Acute vaginitis: Secondary | ICD-10-CM | POA: Diagnosis not present

## 2016-08-16 DIAGNOSIS — R300 Dysuria: Secondary | ICD-10-CM | POA: Diagnosis not present

## 2016-08-16 DIAGNOSIS — A5901 Trichomonal vulvovaginitis: Secondary | ICD-10-CM | POA: Diagnosis not present

## 2016-08-16 DIAGNOSIS — R319 Hematuria, unspecified: Secondary | ICD-10-CM | POA: Diagnosis not present

## 2016-08-16 MED FILL — SULFAMETHOXAZOLE/TMP SS TAB: 400-80 | 3 days supply | Qty: 6 | Fill #0

## 2016-08-16 MED FILL — TINIDAZOLE 500 MG TABLET: 500 | 5 days supply | Qty: 10 | Fill #0

## 2016-08-16 MED FILL — metroNIDAZOLE 500 MG TABS: 500 | 1 days supply | Qty: 4 | Fill #0

## 2016-08-22 DIAGNOSIS — Z1231 Encounter for screening mammogram for malignant neoplasm of breast: Secondary | ICD-10-CM | POA: Diagnosis not present

## 2016-08-26 MED FILL — MONTELUKAST SOD 10 MG TAB: 10 | 30 days supply | Qty: 30 | Fill #4

## 2016-09-06 DIAGNOSIS — A599 Trichomoniasis, unspecified: Secondary | ICD-10-CM | POA: Diagnosis not present

## 2016-09-07 ENCOUNTER — Ambulatory Visit (INDEPENDENT_AMBULATORY_CARE_PROVIDER_SITE_OTHER): Payer: 59 | Admitting: Allergy & Immunology

## 2016-09-07 ENCOUNTER — Encounter: Payer: Self-pay | Admitting: Allergy & Immunology

## 2016-09-07 VITALS — BP 112/70 | HR 80 | Resp 20

## 2016-09-07 DIAGNOSIS — J3089 Other allergic rhinitis: Secondary | ICD-10-CM

## 2016-09-07 DIAGNOSIS — L508 Other urticaria: Secondary | ICD-10-CM

## 2016-09-07 DIAGNOSIS — J4540 Moderate persistent asthma, uncomplicated: Secondary | ICD-10-CM

## 2016-09-07 LAB — COMPLETE METABOLIC PANEL WITH GFR
ALT: 13 U/L (ref 6–29)
AST: 15 U/L (ref 10–35)
Albumin: 3.6 g/dL (ref 3.6–5.1)
Alkaline Phosphatase: 69 U/L (ref 33–115)
BUN: 12 mg/dL (ref 7–25)
CHLORIDE: 106 mmol/L (ref 98–110)
CO2: 21 mmol/L (ref 20–31)
Calcium: 8.8 mg/dL (ref 8.6–10.2)
Creat: 0.9 mg/dL (ref 0.50–1.10)
GFR, EST AFRICAN AMERICAN: 89 mL/min (ref >=60)
GFR, EST NON AFRICAN AMERICAN: 77 mL/min (ref >=60)
GLUCOSE: 85 mg/dL (ref 65–99)
Potassium: 4 mmol/L (ref 3.5–5.3)
Sodium: 136 mmol/L (ref 135–146)
Total Bilirubin: 0.3 mg/dL (ref 0.2–1.2)
Total Protein: 7.7 g/dL (ref 6.1–8.1)

## 2016-09-07 LAB — CBC WITH DIFFERENTIAL/PLATELET
Basophils Absolute: 44 cells/uL (ref 0–200)
Basophils Relative: 1 %
Eosinophils Absolute: 220 cells/uL (ref 15–500)
Eosinophils Relative: 5 %
HCT: 35.3 % (ref 35.0–45.0)
Hemoglobin: 11.3 g/dL — ABNORMAL LOW (ref 11.7–15.5)
LYMPHS ABS: 1012 {cells}/uL (ref 850–3900)
Lymphocytes Relative: 23 %
MCH: 27.8 pg (ref 27.0–33.0)
MCHC: 32 g/dL (ref 32.0–36.0)
MCV: 86.7 fL (ref 80.0–100.0)
MPV: 9.5 fL (ref 7.5–12.5)
Monocytes Absolute: 396 cells/uL (ref 200–950)
Monocytes Relative: 9 %
Neutro Abs: 2728 cells/uL (ref 1500–7800)
Neutrophils Relative %: 62 %
Platelets: 254 10*3/uL (ref 140–400)
RBC: 4.07 MIL/uL (ref 3.80–5.10)
RDW: 14.9 % (ref 11.0–15.0)
WBC: 4.4 10*3/uL (ref 3.8–10.8)

## 2016-09-07 MED ORDER — METHYLPREDNISOLONE ACETATE 40 MG/ML IJ SUSP
40.0000 mg | Freq: Once | INTRAMUSCULAR | Status: AC
  Administered 2016-09-07: 40 mg via INTRAMUSCULAR

## 2016-09-07 NOTE — Progress Notes (Signed)
FOLLOW UP  Date of Service/Encounter:  09/07/16   Assessment:   Chronic idiopathic urticaria  Non-seasonal allergic rhinitis - environmental allergy testing pending  Moderate persistent asthma, uncomplicated   Asthma Reportables:  Severity: moderate persistent  Risk: high Control: well controlled   Plan/Recommendations:   1. Moderate persistent asthma, uncomplicated - Continue with the same medications. - Daily controller medication(s): Symbicort 80/4.5 two puffs twice daily with spacer - Rescue medications: ProAir 4 puffs every 4-6 hours as needed - Asthma control goals:  * Full participation in all desired activities (may need albuterol before activity) * Albuterol use two time or less a week on average (not counting use with activity) * Cough interfering with sleep two time or less a month * Oral steroids no more than once a year * No hospitalizations  2. Perennial allergic rhinitis  - Continue with the Flonase and the Astelin two sprays per nostril daily.  - We will get blood allergy testing today to see what environmental allergies you have.  - We can discuss shots if there is evidence of environmental allergies.   3. Chronic urticaria - The rash today seems more consistent with chronic urticaria.  - She may have a combination of eczema as well as urticaria, but at this time her rash looks more like urticaria.  - She has not had a biopsy, which might be helpful in the future.  - We will get some labs to rule out serious causes of hives: CMP, CBC with differential, serum tryptase, inflammatory markers  - We did give a steroid injection today.  - Treat with high-dose antihistamines: Allegra (fexofeandine 180mg ) two tablets in the morning + Xyzal (levocetirizine 5mg ) 1-2 tablets in the evening - Xolair consent signed today. - She will return tomorrow for her first injection.  - The familial nature of her rash brings to mind syndrome such as familial cold auto  inflammatory syndrome or PLCG2-associated Antibody Deficiency and Immune Dysregulation (PLAID). - Reassuring and her history is her lack of recurrent infections which would point more towards PLAID and the fact that her urticaria occur in environments other than the cold.   4. Return in about 2 months (around 11/08/2016).   Subjective:   Misty Barnes is a 46 y.o. female presenting today for follow up of  Chief Complaint  Patient presents with  . Allergy Testing    Misty Barnes has a history of the following: Patient Active Problem List   Diagnosis Date Noted  . Flexural atopic dermatitis 12/31/2015  . Food allergy 12/31/2015  . Allergic rhinoconjunctivitis 12/31/2015  . Moderate persistent asthma, uncomplicated 16/11/9602  . Obesity (BMI 35.0-39.9 without comorbidity) 12/15/2011  . Breast mass, right 10/26/2011  . Hidradenitis suppurativa of right axilla 10/26/2011  . Myofascial muscle pain 05/10/2011  . Cervicalgia 05/10/2011  . Intractable migraine with aura 05/10/2011  . Vitamin D deficiency 05/10/2011    History obtained from: chart review and patient.  Misty Barnes was referred by Misty Stains, MD.     Misty Barnes is a 45 y.o. female presenting for a follow up visit. She was last seen in July 2018. At that time, her lung function was normal and we continued her on Symbicort 80 g/4.5 g 2 puffs morning and 2 puffs at night. We also continued her on Flonase as well as Astelin for her allergic rhinitis. We did plan to do skin testing at this visit if her symptoms were not well controlled. She had a rash at  the last visit that seemed more like urticaria rather than eczema. I recommended treatment with suppressive dosing of antihistamines including Allegra 2 tablets in the morning and Xyzal 1-2 tablets in the evening. We also continued her on Singulair 10 mg daily.  Since the last visit, she has mostly done well. Her asthma has been under good control. Misty Barnes's asthma has  been well controlled. She has not required rescue medication, experienced nocturnal awakenings due to lower respiratory symptoms, nor have activities of daily living been limited. She has required no Emergency Department or Urgent Care visits for her asthma. She has required zero courses of systemic steroids for asthma exacerbations since the last visit. ACT score today is 21, indicating excellent asthma symptom control. She feels that her Symbicort is doing an excellent job of controlling her symptoms.  Her rash is another story. She did have a flare that started around 1-2 weeks ago. She was put on prednisone 40 mg daily for 7 days, which did take the edge off but has not cleared the rash completely. She reports that this is been diagnosed as eczema in the past. However, today her skin features raised urticarial lesions. Triggers for the rash include cold and hot weather. She does have problems with hot showers, but has changed to more lukewarm showers with improvement. There do not seem to be any food triggers to her symptoms. It is difficult to get a frequency of how often these rashes occur, but she does report that many members of her family have similar rash. The antihistamines to help somewhat, but it appears that she is not taking the suppressive dosing that was recommended at the last visit. She is followed by Dr. Matthew Barnes dermatology, and is on 2 different steroid ointments for her rash. These have not been helping with this current outbreak. She has never had a skin biopsy to her knowledge.  She continues to avoid as well as shellfish. Her EpiPen is up-to-date. There is no history of recurrent infections. Otherwise, there have been no changes to her past medical history, surgical history, family history, or social history.    Review of Systems: a 14-point review of systems is pertinent for what is mentioned in HPI.  Otherwise, all other systems were negative. Constitutional: negative other than  that listed in the HPI Eyes: negative other than that listed in the HPI Ears, nose, mouth, throat, and face: negative other than that listed in the HPI Respiratory: negative other than that listed in the HPI Cardiovascular: negative other than that listed in the HPI Gastrointestinal: negative other than that listed in the HPI Genitourinary: negative other than that listed in the HPI Integument: negative other than that listed in the HPI Hematologic: negative other than that listed in the HPI Musculoskeletal: negative other than that listed in the HPI Neurological: negative other than that listed in the HPI Allergy/Immunologic: negative other than that listed in the HPI    Objective:   Blood pressure 112/70, pulse 80, resp. rate 20, last menstrual period 11/07/2010. There is no height or weight on file to calculate BMI.   Physical Exam:  General: Alert, interactive, in no acute distress. Pleasant but in mild distress. Eyes: No conjunctival injection present on the right, No conjunctival injection present on the left, PERRL bilaterally, No discharge on the right, No discharge on the left and No Horner-Trantas dots present Ears: Right TM pearly gray with normal light reflex, Left TM pearly gray with normal light reflex, Right TM intact without  perforation and Left TM intact without perforation.  Nose/Throat: External nose within normal limits, nasal crease present and septum midline, turbinates edematous and pale with clear discharge, post-pharynx erythematous with cobblestoning in the posterior oropharynx. Tonsils 2+ without exudates Neck: Supple without thyromegaly. Lungs: Clear to auscultation without wheezing, rhonchi or rales. No increased work of breathing. CV: Normal S1/S2, no murmurs. Capillary refill <2 seconds.  Skin: Scattered erythematous urticarial type lesions primarily located on the bilateral arms, shoulders, upper chest, upper back, and neck. Neuro:   Grossly intact. No  focal deficits appreciated. Responsive to questions.   Diagnostic studies:   Spirometry: results normal (FEV1: 1.92/83%, FVC: 2.17/78%, FEV1/FVC: 88%).    Spirometry consistent with normal pattern.   Allergy Studies: none      Salvatore Marvel, MD [place] [place]

## 2016-09-07 NOTE — Patient Instructions (Addendum)
1. Moderate persistent asthma, uncomplicated - Continue with the same medications. - Daily controller medication(s): Symbicort 80/4.5 two puffs twice daily with spacer - Rescue medications: ProAir 4 puffs every 4-6 hours as needed - Asthma control goals:  * Full participation in all desired activities (may need albuterol before activity) * Albuterol use two time or less a week on average (not counting use with activity) * Cough interfering with sleep two time or less a month * Oral steroids no more than once a year * No hospitalizations  2. Perennial allergic rhinitis  - Continue with the Flonase and the Astelin two sprays per nostril daily.  - We will get blood allergy testing today to see what environmental allergies you have.  - We can discuss shots if there is evidence of environmental allergies.   3. Chronic urticaria - Your rash today seems more consistent with chronic urticaria.  - We will get some labs to rule out serious causes of hives.  - We gave you a steroid injection today.  - Come back after you go to the lab for a Xolair sample.  - Treat with high-dose antihistamines: Allegra (fexofeandine 180mg ) two tablets in the morning + Xyzal (levocetirizine 5mg ) 1-2 tablets in the evening  4. Return in about 2 months (around 11/08/2016).  Please inform us of any Emergency Department visits, hospitalizations, or changes in symptoms. Call us before going to the ED for breathing or allergy symptoms since we might be able to fit you in for a sick visit. Feel free to contact us anytime with any questions, problems, or concerns.  It was a pleasure to see you again today!   Websites that have reliable patient information: 1. American Academy of Asthma, Allergy, and Immunology: www.aaaai.org 2. Food Allergy Research and Education (FARE): foodallergy.org 3. Mothers of Asthmatics: http://www.asthmacommunitynetwork.org 4. American College of Allergy, Asthma, and Immunology:  www.acaai.org

## 2016-09-08 ENCOUNTER — Ambulatory Visit (INDEPENDENT_AMBULATORY_CARE_PROVIDER_SITE_OTHER): Payer: 59 | Admitting: *Deleted

## 2016-09-08 DIAGNOSIS — L508 Other urticaria: Secondary | ICD-10-CM

## 2016-09-08 DIAGNOSIS — L501 Idiopathic urticaria: Secondary | ICD-10-CM

## 2016-09-08 LAB — CP584 ZONE 3
ALLERGEN, A. ALTERNATA, M6: 0.26 kU/L — AB
ALLERGEN, C. HERBARUM, M2: 0.37 kU/L — AB
ALLERGEN, OAK, T7: 0.46 kU/L — AB
ALLERGEN, S. BOTRYOSUM, M10: 0.23 kU/L — AB
Allergen, Black Locust, Acacia9: 0.15 kU/L — ABNORMAL HIGH
Allergen, Cedar tree, t12: 0.25 kU/L — ABNORMAL HIGH
Allergen, Comm Silver Birch, t9: 0.92 kU/L — ABNORMAL HIGH
Allergen, D pternoyssinus,d7: >100 kU/L — ABNORMAL HIGH
Allergen, Mucor Racemosus, M4: 0.27 kU/L — ABNORMAL HIGH
Allergen, Mulberry, t76: 0.14 kU/L — ABNORMAL HIGH
Allergen, P. notatum, m1: 0.24 kU/L — ABNORMAL HIGH
Aspergillus fumigatus, m3: 0.66 kU/L — ABNORMAL HIGH
BAHIA GRASS: 11.5 kU/L — AB
BOX ELDER: 0.2 kU/L — AB
Bermuda Grass: 11.4 kU/L — ABNORMAL HIGH
CAT DANDER: 4.99 kU/L — AB
Cockroach: 1.12 kU/L — ABNORMAL HIGH
Common Ragweed: 6.02 kU/L — ABNORMAL HIGH
D. farinae: >100 kU/L — ABNORMAL HIGH
Dog Dander: 17.7 kU/L — ABNORMAL HIGH
Elm IgE: 0.97 kU/L — ABNORMAL HIGH
Johnson Grass: 7.29 kU/L — ABNORMAL HIGH
Meadow Grass: 43.2 kU/L — ABNORMAL HIGH
NETTLE: 0.39 kU/L — AB
PECAN/HICKORY TREE IGE: 15.6 kU/L — AB
PLANTAIN: 13 kU/L — AB
ROUGH PIGWEED IGE: 0.59 kU/L — AB

## 2016-09-08 LAB — C-REACTIVE PROTEIN: CRP: 21.6 mg/L — AB (ref <8.0)

## 2016-09-08 LAB — SEDIMENTATION RATE: Sed Rate: 36 mm/hr — ABNORMAL HIGH (ref 0–20)

## 2016-09-08 MED ORDER — OMALIZUMAB 150 MG ~~LOC~~ SOLR
300.0000 mg | SUBCUTANEOUS | Status: AC
  Administered 2016-09-08: 300 mg via SUBCUTANEOUS

## 2016-09-08 NOTE — Progress Notes (Signed)
Immunotherapy   Patient Details  Name: Misty Barnes MRN: [id number] Date of Birth: [birth year]  09/08/2016  Misty Barnes started injections for  Xolair 300 mg every 28 days for urticaria.  Epi-Pen:Epi-Pen Available  Consent signed and patient instructions given. No problems after 60 minutes in the office.   Constance Holster 09/08/2016, 4:44 PM

## 2016-09-09 LAB — TRYPTASE: Tryptase: 7.6 ug/L (ref <11)

## 2016-09-09 NOTE — Addendum Note (Signed)
Addended by: Martyn Malay on: 09/09/2016 12:22 PM   Modules accepted: Orders

## 2016-09-13 MED FILL — KETOCONAZOLE 2% CREAM: 2 | 10 days supply | Qty: 30 | Fill #3

## 2016-09-13 MED FILL — TOPIRAMATE 100 MG TABLET: 100 | 30 days supply | Qty: 30 | Fill #5

## 2016-09-13 MED FILL — DESONIDE 0.05% OINTMENT: 0.05 | 15 days supply | Qty: 30 | Fill #2

## 2016-10-04 ENCOUNTER — Telehealth: Payer: Self-pay

## 2016-10-04 NOTE — Telephone Encounter (Signed)
Patient called to request price quote on Xolair injections if she paid out of pocket. I advised an estimate of 500. She said that she would just wait to restart in October 2018 when her insurance goes into effect.

## 2016-10-04 NOTE — Telephone Encounter (Signed)
That is expensive. I wonder if she can use a [employer] copay assistance program? I will route to Tammy to see if this is an option.  Salvatore Marvel, MD [facility] [place] [facility] [place]

## 2016-10-04 NOTE — Telephone Encounter (Signed)
I had left message for patient on 09/27/16 regarding  insurance I was trying to get approval for Xolair no longer in effect but got no response.  I called patient again today to discuss getting assistance through [place] to care foundation and left message to contact me to discuss this option.

## 2016-10-05 NOTE — Telephone Encounter (Signed)
Thanks, Tammy!   Joel Gallagher, MD [facility] [place]  

## 2016-10-06 ENCOUNTER — Ambulatory Visit: Payer: 59

## 2016-10-18 ENCOUNTER — Telehealth: Payer: Self-pay | Admitting: *Deleted

## 2016-10-18 NOTE — Telephone Encounter (Signed)
Was in process of submitting patient for Xolair for urticaria.  Her [employer] never responded to fax request for PA approval.  I contacted them and they advised she no longer had that insurance coverage. I call and left message for patient to contact me to advise if she has new insurance or other options available to patient.

## 2016-10-18 NOTE — Telephone Encounter (Signed)
Per Dr Ernst Bowler staff message on 10/04/16 patient had called to find out cost of Xolair with no insurance coverage.  I called and again left message for patient to contact me so I can discuss going through [facility] for free drug but she has never called back. I will assume she is not interested in starting therapy at this time

## 2018-01-12 ENCOUNTER — Encounter: Payer: Self-pay | Admitting: Family Medicine

## 2018-01-12 ENCOUNTER — Ambulatory Visit: Payer: PRIVATE HEALTH INSURANCE | Admitting: Family Medicine

## 2018-01-12 VITALS — BP 126/92 | HR 76 | Resp 17 | Ht 65.5 in | Wt 244.0 lb

## 2018-01-12 DIAGNOSIS — H1010 Acute atopic conjunctivitis, unspecified eye: Secondary | ICD-10-CM

## 2018-01-12 DIAGNOSIS — J4541 Moderate persistent asthma with (acute) exacerbation: Secondary | ICD-10-CM | POA: Diagnosis not present

## 2018-01-12 DIAGNOSIS — J4540 Moderate persistent asthma, uncomplicated: Secondary | ICD-10-CM

## 2018-01-12 DIAGNOSIS — Z91018 Allergy to other foods: Secondary | ICD-10-CM

## 2018-01-12 DIAGNOSIS — J309 Allergic rhinitis, unspecified: Secondary | ICD-10-CM | POA: Diagnosis not present

## 2018-01-12 DIAGNOSIS — L501 Idiopathic urticaria: Secondary | ICD-10-CM | POA: Insufficient documentation

## 2018-01-12 MED ORDER — ALBUTEROL SULFATE HFA 108 (90 BASE) MCG/ACT IN AERS: INHALATION_SPRAY | RESPIRATORY_TRACT | 1 refills | Status: DC

## 2018-01-12 MED ORDER — FEXOFENADINE HCL 180 MG PO TABS: ORAL_TABLET | ORAL | 5 refills | Status: DC

## 2018-01-12 MED ORDER — AZELASTINE HCL 0.15 % NA SOLN: 2.0000 | Freq: Two times a day (BID) | NASAL | 5 refills | Status: DC

## 2018-01-12 MED ORDER — LEVOCETIRIZINE DIHYDROCHLORIDE 5 MG PO TABS: ORAL_TABLET | ORAL | 5 refills | Status: DC

## 2018-01-12 MED ORDER — BUDESONIDE-FORMOTEROL FUMARATE 80-4.5 MCG/ACT IN AERO: 2.0000 | INHALATION_SPRAY | Freq: Two times a day (BID) | RESPIRATORY_TRACT | 5 refills | Status: DC

## 2018-01-12 NOTE — Patient Instructions (Addendum)
1. Moderate persistent asthma, uncomplicated - Continue with the same medications. - Daily controller medication(s): Symbicort 80/4.5 two puffs twice daily with spacer - Rescue medications: ProAir 4 puffs every 4-6 hours as needed - Asthma control goals:  * Full participation in all desired activities (may need albuterol before activity) * Albuterol use two time or less a week on average (not counting use with activity) * Cough interfering with sleep two time or less a month * Oral steroids no more than once a year * No hospitalizations  2. Perennial allergic rhinitis  - Continue with the Flonase and the Astelin two sprays per nostril daily.  - We will get blood allergy testing today to see what environmental allergies you have. We will call you when these lab results become available. - We can discuss shots if there is evidence of environmental allergies.   3. Chronic urticaria  - We will recheck some labs to rule out serious causes of hives.   -I will contact Dr. Alanda Slim to make sure we are on the same page with treatment  - I will submit paperwork to restart Xolair pending the conversation with Dr. Alanda Slim - Treat with high-dose antihistamines: Allegra (fexofeandine 180mg ) two tablets in the morning + Xyzal (levocetirizine 5mg ) 1-2 tablets in the evening as needed  4. Follow up in 2 weeks or sooner if needed Please inform us of any Emergency Department visits, hospitalizations, or changes in symptoms. Call us before going to the ED for breathing or allergy symptoms since we might be able to fit you in for a sick visit. Feel free to contact us anytime with any questions, problems, or concerns.   Websites that have reliable patient information: 1. American Academy of Asthma, Allergy, and Immunology: www.aaaai.org 2. Food Allergy Research and Education (FARE): foodallergy.org 3. Mothers of Asthmatics: http://www.asthmacommunitynetwork.org 4. American College of Allergy, Asthma, and  Immunology: www.acaai.org

## 2018-01-12 NOTE — Progress Notes (Addendum)
[facility] [place] [address] Dept: [phone]  FOLLOW UP NOTE  Patient ID: Misty Barnes, female    DOB: [birth year]  Age: 47 y.o. MRN: [id number] Date of Office Visit: 01/12/2018  Assessment  Chief Complaint: Asthma  HPI Misty Barnes is a 47 year old female who presents to the clinic for a follow up visit. She was last seen in this clinic on 09/07/2016 by Dr. Ernst Bowler for evaluation of chronic urticaria, asthma, and allergic rhinitis. At that time, she received one dose of Xolair after which she reports her hives improved significantly and within the first several days after the injection. She reports she lost her insurance coverage and needed to stop the Xolair injections. At today's visit, she reports that she experiences hives that occur about once a month and take about 3 weeks to clear. They mainly occur on her face, arms and neck and are red, raised, and very itchy.  She reports her asthma as moderately well controlled with no shortness of breath or cough. She reports intermittent wheeze. She has used Symbicort 80-2 puffs twice a day, however, she has been out of her medications.  She reports she has been seeing Dr. 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 She reports she ss currently taking Valacyclovir, doxycycline, and a prednisone taper. She has not seen her primary care doctor for about 1 year. Her current medications are listed in the chart.    Drug Allergies:  Allergies  Allergen Reactions  . Penicillins Anaphylaxis  . Percocet [Oxycodone-Acetaminophen] Nausea And Vomiting  . Sulfa Antibiotics     Unknown reaction    Physical Exam: BP (!) 126/92 (BP Location: Left Arm, Patient Position: Sitting, Cuff Size: Normal)   Pulse 76   Resp 17   Ht 5' 5.5" (1.664 m)   Wt 244 lb (110.7 kg)   LMP 11/07/2010   BMI 39.99 kg/m    Physical Exam  Constitutional: She is oriented to person, place, and time. She appears  well-developed and well-nourished.  HENT:  Head: Normocephalic.  Right Ear: External ear normal.  Left Ear: External ear normal.  Mouth/Throat: Oropharynx is clear and moist.  Bilateral nares slightly erythematous and edematous with no nasal drainage noted. Pharynx normal. Ears normal. Eyes normal.   Eyes: Conjunctivae are normal.  Neck: Normal range of motion. Neck supple.  Cardiovascular: Normal rate, regular rhythm and normal heart sounds.  No murmur noted  Pulmonary/Chest: Effort normal and breath sounds normal.  Lungs clear to auscultation  Musculoskeletal: Normal range of motion.  Neurological: She is alert and oriented to person, place, and time.  Skin: Skin is warm and dry.  Psychiatric: She has a normal mood and affect. Her behavior is normal. Judgment and thought content normal.  Vitals reviewed.   Diagnostics: FVC 2.64, FEV1 2.19.  Predicted FVC 3.09, predicted FEV1 2.50.  Spirometry is within the normal range.  Assessment and Plan: 1. Idiopathic urticaria   2. Moderate persistent asthma, uncomplicated   3. Allergic rhinoconjunctivitis   4. Food allergy     Meds ordered this encounter  Medications  . Azelastine HCl 0.15 % SOLN    Sig: Place 2 sprays into both nostrils 2 (two) times daily.    Dispense:  30 mL    Refill:  5  . budesonide-formoterol (SYMBICORT) 80-4.5 MCG/ACT inhaler    Sig: Inhale 2 puffs into the lungs 2 (two) times daily.    Dispense:  1 Inhaler  Refill:  5  . albuterol (PROAIR HFA) 108 (90 Base) MCG/ACT inhaler    Sig: Inhale 4 puffs every 4-6 hours as needed for cough, wheeze or shortness of breath    Dispense:  1 Inhaler    Refill:  1  . levocetirizine (XYZAL) 5 MG tablet    Sig: Take 1-2 tablets by mouth in the evening as needed    Dispense:  60 tablet    Refill:  5  . fexofenadine (ALLEGRA ALLERGY) 180 MG tablet    Sig: Take 2 tablets in the morning as needed    Dispense:  60 tablet    Refill:  5    Patient Instructions  1.  Moderate persistent asthma, uncomplicated - Continue with the same medications. - Daily controller medication(s): Symbicort 80/4.5 two puffs twice daily with spacer - Rescue medications: ProAir 4 puffs every 4-6 hours as needed - Asthma control goals:  * Full participation in all desired activities (may need albuterol before activity) * Albuterol use two time or less a week on average (not counting use with activity) * Cough interfering with sleep two time or less a month * Oral steroids no more than once a year * No hospitalizations  2. Perennial allergic rhinitis  - Continue with the Flonase and the Astelin two sprays per nostril daily.  - We will get blood allergy testing today to see what environmental allergies you have. We will call you when these lab results become available. - We can discuss shots if there is evidence of environmental allergies.   3. Chronic urticaria  - We will recheck some labs to rule out serious causes of hives.   -I will contact Dr. Alanda Slim to make sure we are on the same page with treatment  - I will submit paperwork to restart Xolair pending the conversation with Dr. Alanda Slim - Treat with high-dose antihistamines: Allegra (fexofeandine 180mg ) two tablets in the morning + Xyzal (levocetirizine 5mg ) 1-2 tablets in the evening as needed  4. Follow up in 2 weeks or sooner if needed  Return in about 2 weeks (around 01/26/2018), or if symptoms worsen or fail to improve.    Thank you for the opportunity to care for this patient.  Please do not hesitate to contact me with questions.  Gareth Morgan, FNP [facility]

## 2018-01-13 LAB — BASIC METABOLIC PANEL
BUN / CREAT RATIO: 18 (ref 9–23)
BUN: 14 mg/dL (ref 6–24)
CO2: 25 mmol/L (ref 20–29)
Calcium: 9.1 mg/dL (ref 8.7–10.2)
Chloride: 101 mmol/L (ref 96–106)
Creatinine, Ser: 0.8 mg/dL (ref 0.57–1.00)
GFR calc Af Amer: 102 mL/min/{1.73_m2} (ref >59)
GFR calc non Af Amer: 89 mL/min/{1.73_m2} (ref >59)
GLUCOSE: 92 mg/dL (ref 65–99)
POTASSIUM: 3.9 mmol/L (ref 3.5–5.2)
Sodium: 138 mmol/L (ref 134–144)

## 2018-01-13 LAB — CBC WITH DIFFERENTIAL/PLATELET
Basophils Absolute: 0 10*3/uL (ref 0.0–0.2)
Basos: 0 %
EOS (ABSOLUTE): 0.1 10*3/uL (ref 0.0–0.4)
Eos: 1 %
HEMOGLOBIN: 11.3 g/dL (ref 11.1–15.9)
Hematocrit: 34.2 % (ref 34.0–46.6)
IMMATURE GRANS (ABS): 0 10*3/uL (ref 0.0–0.1)
Immature Granulocytes: 0 %
LYMPHS: 23 %
Lymphocytes Absolute: 1.7 10*3/uL (ref 0.7–3.1)
MCH: 29 pg (ref 26.6–33.0)
MCHC: 33 g/dL (ref 31.5–35.7)
MCV: 88 fL (ref 79–97)
MONOCYTES: 9 %
Monocytes Absolute: 0.7 10*3/uL (ref 0.1–0.9)
Neutrophils Absolute: 5.1 10*3/uL (ref 1.4–7.0)
Neutrophils: 67 %
Platelets: 256 10*3/uL (ref 150–450)
RBC: 3.9 x10E6/uL (ref 3.77–5.28)
RDW: 13.3 % (ref 12.3–15.4)
WBC: 7.7 10*3/uL (ref 3.4–10.8)

## 2018-01-13 LAB — ENA+DNA/DS+SJORGEN'S
ENA RNP AB: 0.3 AI (ref 0.0–0.9)
ENA SM AB SER-ACNC: 0.2 AI (ref 0.0–0.9)
dsDNA Ab: <1 IU/mL (ref 0–9)

## 2018-01-13 LAB — ANA W/REFLEX: Anti Nuclear Antibody(ANA): POSITIVE — AB

## 2018-01-13 LAB — SEDIMENTATION RATE: Sed Rate: 42 mm/hr — ABNORMAL HIGH (ref 0–32)

## 2018-01-13 LAB — C-REACTIVE PROTEIN: CRP: 13 mg/L — ABNORMAL HIGH (ref 0–10)

## 2018-01-17 ENCOUNTER — Telehealth: Payer: Self-pay

## 2018-01-17 NOTE — Telephone Encounter (Signed)
-----   Message from [name], FNP sent at 01/17/2018  2:19 PM EST ----- Can you please have this patient call the office for her lab results? Her inflammatory markers where higher than they were at her last visit and I want to refer her to rheumatology. I have not been able to get her by the phone numbers she has listed. Thank you

## 2018-01-17 NOTE — Telephone Encounter (Signed)
Left message for patient to call back  

## 2018-01-22 ENCOUNTER — Telehealth: Payer: Self-pay | Admitting: Family Medicine

## 2018-01-22 NOTE — Progress Notes (Signed)
Lab findings discussed with this patient and questions answered. We will refer her to rheumatology for further evaluation.

## 2018-01-22 NOTE — Progress Notes (Signed)
Referral has been placed. 

## 2018-01-22 NOTE — Telephone Encounter (Signed)
Elevated inflammatory markers, positive ANA lab results discussed and patient agrees to go to rheumatology for further evaluation. We will move forward with processing Xolair paperwork as well. Patient verbalized understanding and will call back with any questions

## 2018-01-25 NOTE — Telephone Encounter (Signed)
Called patient again. Still no answer. I am going to go ahead and send a letter to request a call back.

## 2018-02-19 ENCOUNTER — Ambulatory Visit (INDEPENDENT_AMBULATORY_CARE_PROVIDER_SITE_OTHER): Payer: PRIVATE HEALTH INSURANCE | Admitting: *Deleted

## 2018-02-19 DIAGNOSIS — L501 Idiopathic urticaria: Secondary | ICD-10-CM | POA: Diagnosis not present

## 2018-02-19 DIAGNOSIS — Z91018 Allergy to other foods: Secondary | ICD-10-CM

## 2018-02-19 MED ORDER — OMALIZUMAB 150 MG ~~LOC~~ SOLR
300.0000 mg | SUBCUTANEOUS | Status: AC
  Administered 2018-02-19 – 2018-07-30 (×6): 300 mg via SUBCUTANEOUS

## 2018-02-19 MED ORDER — EPINEPHRINE 0.3 MG/0.3ML IJ SOAJ: INTRAMUSCULAR | 2 refills | Status: AC

## 2018-02-19 NOTE — Progress Notes (Signed)
Immunotherapy   Patient Details  Name: Misty Barnes MRN: [id number] Date of Birth: 12/03/1970  02/19/2018  Marlane Hatcher Nicklas started injections for  Xolair 300mg   Frequency:Every 4 weeks Epi-Pen:Prescription for Epi-Pen given Consent signed and patient instructions given.   Orlene Erm 02/19/2018, 3:41 PM

## 2018-03-01 NOTE — Progress Notes (Signed)
Office Visit Note  Patient: Misty Barnes             Date of Birth: 1970/05/17           MRN: [id number]             PCP: Harlan Stains, MD Referring: [name], FNP Visit Date: 03/14/2018 Occupation: LPN   Subjective:  Dry mouth and dry eyes.   History of Present Illness: Misty Barnes is a 48 y.o. female seen in consultation per request of her PCP.  According to patient she has had eczema since she was a child.  In July 2019 she developed a herpes blister on her lip.  She states she rubbed it and then rubbed her eyes and it is spread to her eyes and face.  She states she had developed visual changes and was diagnosed with herpetic keratitis.  She was started on valacyclovir.  She states gradually the rash spread all over and she was referred to [facility]mology.  Where she was diagnosed with herpetic keratitis and blepharitis.  She was also started on Restasis for dry eyes.  She was given a prednisone taper starting in November which she took for 3 months and gained 50 pounds.  She has been off prednisone now.  She has been using Restasis eyedrops.  And also some other eyedrops.  She was also referred to allergist for urticaria.  She had labs done which showed positive ANA, positive Ro and positive Lyme antibody.  For that reason she was referred to me.  She denies any history of joint pain or joint swelling.  Activities of Daily Living:  Patient reports morning stiffness for 0 minutes.   Patient Denies nocturnal pain.  Difficulty dressing/grooming: Denies Difficulty climbing stairs: Denies Difficulty getting out of chair: Denies Difficulty using hands for taps, buttons, cutlery, and/or writing: Denies  Review of Systems  Constitutional: Positive for fatigue.  HENT: Positive for mouth dryness. Negative for mouth sores, trouble swallowing and trouble swallowing.   Eyes: Positive for dryness. Negative for pain, redness and itching.  Respiratory: Negative for shortness of  breath, wheezing and difficulty breathing.   Cardiovascular: Negative for chest pain, hypertension and irregular heartbeat.  Gastrointestinal: Negative for abdominal pain, blood in stool, constipation and diarrhea.  Endocrine: Positive for increased urination.  Genitourinary: Negative for painful urination, nocturia and pelvic pain.  Musculoskeletal: Negative for arthralgias, joint pain, joint swelling, muscle weakness, morning stiffness and muscle tenderness.  Skin: Positive for rash and hair loss.  Allergic/Immunologic: Negative for susceptible to infections.  Neurological: Positive for headaches. Negative for dizziness, light-headedness, memory loss and weakness.  Hematological: Negative for anemia and bruising/bleeding tendency.  Psychiatric/Behavioral: Negative for confusion. The patient is not nervous/anxious.     PMFS History:  Patient Active Problem List   Diagnosis Date Noted  . Idiopathic urticaria 01/12/2018  . Flexural atopic dermatitis 12/31/2015  . Food allergy 12/31/2015  . Allergic rhinoconjunctivitis 12/31/2015  . Moderate persistent asthma without complication 69/67/8938  . Obesity (BMI 35.0-39.9 without comorbidity) 12/15/2011  . Breast mass, right 10/26/2011  . Hidradenitis suppurativa of right axilla 10/26/2011  . Myofascial muscle pain 05/10/2011  . Cervicalgia 05/10/2011  . Intractable migraine with aura 05/10/2011  . Vitamin D deficiency 05/10/2011    Past Medical History:  Diagnosis Date  . Asthma   . Chronic kidney disease    stones  . Eczema   . Headache(784.0)    migraines  . Hypertension   .  PONV (postoperative nausea and vomiting)     Family History  Problem Relation Age of Onset  . Hypertension Father   . Multiple sclerosis Mother   . Allergic rhinitis Paternal Grandmother   . Asthma Paternal Grandmother   . Allergic rhinitis Paternal Aunt   . Asthma Paternal Aunt   . Allergies Brother   . Healthy Brother   . Healthy Son   . Healthy  Daughter    Past Surgical History:  Procedure Laterality Date  . ABDOMINAL HYSTERECTOMY    . CESAREAN SECTION     x 2  . CYSTOSCOPY  01/10/2011   Procedure: CYSTOSCOPY;  Surgeon: Lubertha South Romine;  Location: [facility];  Service: Gynecology;  Laterality: N/A;  . ENDOMETRIAL ABLATION  2005  . HERNIA REPAIR    . LIPOMA EXCISION     cs   Social History   Social History Narrative  . Not on file    There is no immunization history on file for this patient.   Objective: Vital Signs: BP 103/65 (BP Location: Right Arm, Patient Position: Sitting, Cuff Size: Large)   Pulse 79   Resp 16   Ht 5' 5.5" (1.664 m)   Wt 251 lb (113.9 kg)   LMP 11/07/2010   BMI 41.13 kg/m    Physical Exam Vitals signs and nursing note reviewed.  Constitutional:      Appearance: She is well-developed.  HENT:     Head: Normocephalic and atraumatic.  Eyes:     Conjunctiva/sclera: Conjunctivae normal.  Neck:     Musculoskeletal: Normal range of motion.  Cardiovascular:     Rate and Rhythm: Normal rate and regular rhythm.     Heart sounds: Normal heart sounds.  Pulmonary:     Effort: Pulmonary effort is normal.     Breath sounds: Normal breath sounds.  Abdominal:     General: Bowel sounds are normal.     Palpations: Abdomen is soft.  Lymphadenopathy:     Cervical: No cervical adenopathy.  Skin:    General: Skin is warm and dry.     Capillary Refill: Capillary refill takes less than 2 seconds.  Neurological:     Mental Status: She is alert and oriented to person, place, and time.  Psychiatric:        Behavior: Behavior normal.      Musculoskeletal Exam: C-spine thoracic lumbar spine good range of motion.  Shoulder joints elbow joints wrist joint MCPs PIPs DIPs been good range of motion with no synovitis.  Hip joints knee joints ankles MTPs PIPs DIPs with good range of motion with no synovitis.  She has some generalized hyperalgesia.  CDAI Exam: CDAI Score: Not documented Patient Global  Assessment: Not documented; Provider Global Assessment: Not documented Swollen: Not documented; Tender: Not documented Joint Exam   Not documented   There is currently no information documented on the homunculus. Go to the Rheumatology activity and complete the homunculus joint exam.  Investigation: Findings:  01/12/18: ANA+, CRP 13, sed rate 42, dsDNA-, RNP 0.3, smith 0.2, Ro >8, La>8   Component     Latest Ref Rng & Units 01/12/2018  Anti Nuclear Antibody(ANA)     Negative Positive (A)   Component     Latest Ref Rng & Units 01/12/2018  CRP     0 - 10 mg/L 13 (H)  Sed Rate     0 - 32 mm/hr 42 (H)   Component     Latest Ref Rng & Units 01/12/2018  dsDNA Ab     0 - 9 IU/mL <1  ENA RNP Ab     0.0 - 0.9 AI 0.3  ENA SM Ab Ser-aCnc     0.0 - 0.9 AI 0.2  ENA SSA (RO) Ab     0.0 - 0.9 AI >8.0 (H)  ENA SSB (LA) Ab     0.0 - 0.9 AI >8.0 (H)  SEE BELOW      Comment   Imaging: No results found.  Recent Labs: Lab Results  Component Value Date   WBC 7.7 01/12/2018   HGB 11.3 01/12/2018   PLT 256 01/12/2018   NA 138 01/12/2018   K 3.9 01/12/2018   CL 101 01/12/2018   CO2 25 01/12/2018   GLUCOSE 92 01/12/2018   BUN 14 01/12/2018   CREATININE 0.80 01/12/2018   BILITOT 0.3 09/07/2016   ALKPHOS 69 09/07/2016   AST 15 09/07/2016   ALT 13 09/07/2016   PROT 7.7 09/07/2016   ALBUMIN 3.6 09/07/2016   CALCIUM 9.1 01/12/2018   GFRAA 102 01/12/2018    Speciality Comments: No specialty comments available.  Procedures:  No procedures performed Allergies: Fish allergy; Penicillins; Percocet [oxycodone-acetaminophen]; and Sulfa antibiotics   Assessment / Plan:     Visit Diagnoses: Sjogren's syndrome with keratoconjunctivitis sicca ([place]) -she has history of severe sicca symptoms that he is going on for 1 year.  Over-the-counter products were discussed.  I am hesitant to put her on pilocarpine because of history of asthma.  I will obtain labs today and start her on Plaquenil.   Indications side effects contraindications of Plaquenil were discussed.  Handout was given and consent was obtained.  Her dose will be 200 mg p.o. twice daily.  She will need baseline eye examination and then yearly eye examination.  Required vaccination was also discussed.  S+ Ro antibody can be associated with arrhythmias she has been advised to get a EKG with her PCP.  For sicca symptoms omega-3 capsules 2 to 4 g a day will be helpful.  Plan: Urinalysis, Routine w reflex microscopic, Sedimentation rate, ANA.  We will call in prescription for Plaquenil once lab results are available.  Positive ANA (antinuclear antibody) - 01/12/18: ANA+, CRP 13, sed rate 42, dsDNA-, RNP 0.3, smith 0.2, Ro >8, La>8 -I will obtain some additional labs today.  Plan: Anti-scleroderma antibody, C3 and C4, Beta-2 glycoprotein antibodies, Cardiolipin antibodies, IgG, IgM, IgA, Lupus Anticoagulant Eval w/Reflex  Other fatigue - Plan: CK, TSH  High risk medication use - Plan: COMPLETE METABOLIC PANEL WITH GFR, Hepatitis B core antibody, IgM, Hepatitis B surface antigen, Hepatitis C antibody, HIV Antibody (routine testing w rflx), QuantiFERON-TB Gold Plus, Serum protein electrophoresis with reflex, IgG, IgA, IgM, Glucose 6 phosphate dehydrogenase, Thiopurine methyltransferase(tpmt)rbc  Idiopathic urticaria-patient has been followed by allergist.  Flexural atopic dermatitis-she has longstanding history of atopic dermatitis.  Myofascial muscle pain-she has generalized pain and arthralgias.  Vitamin D deficiency-her last vitamin D level was normal.  History of asthma-patient reports asthma flares every 5 months.  Hx of migraines   Orders: Orders Placed This Encounter  Procedures  . COMPLETE METABOLIC PANEL WITH GFR  . Urinalysis, Routine w reflex microscopic  . CK  . TSH  . Sedimentation rate  . ANA  . Anti-scleroderma antibody  . C3 and C4  . Beta-2 glycoprotein antibodies  . Cardiolipin antibodies, IgG, IgM,  IgA  . Lupus Anticoagulant Eval w/Reflex  . Hepatitis B core antibody, IgM  . Hepatitis B surface  antigen  . Hepatitis C antibody  . HIV Antibody (routine testing w rflx)  . QuantiFERON-TB Gold Plus  . Serum protein electrophoresis with reflex  . IgG, IgA, IgM  . Glucose 6 phosphate dehydrogenase  . Thiopurine methyltransferase(tpmt)rbc  . CBC with Differential/Platelet  . COMPLETE METABOLIC PANEL WITH GFR   No orders of the defined types were placed in this encounter.   Face-to-face time spent with patient was 50 minutes. Greater than 50% of time was spent in counseling and coordination of care.  Follow-Up Instructions: Return for Sjogren's.   Bo Merino, MD  Note - This record has been created using Editor, commissioning.  Chart creation errors have been sought, but may not always  have been located. Such creation errors do not reflect on  the standard of medical care.

## 2018-03-12 ENCOUNTER — Ambulatory Visit: Payer: PRIVATE HEALTH INSURANCE | Admitting: Rheumatology

## 2018-03-14 ENCOUNTER — Ambulatory Visit: Payer: PRIVATE HEALTH INSURANCE | Admitting: Rheumatology

## 2018-03-14 ENCOUNTER — Encounter: Payer: Self-pay | Admitting: Rheumatology

## 2018-03-14 ENCOUNTER — Telehealth: Payer: Self-pay | Admitting: Pharmacist

## 2018-03-14 VITALS — BP 103/65 | HR 79 | Resp 16 | Ht 65.5 in | Wt 251.0 lb

## 2018-03-14 DIAGNOSIS — M3501 Sicca syndrome with keratoconjunctivitis: Secondary | ICD-10-CM

## 2018-03-14 DIAGNOSIS — M7918 Myalgia, other site: Secondary | ICD-10-CM

## 2018-03-14 DIAGNOSIS — R5383 Other fatigue: Secondary | ICD-10-CM

## 2018-03-14 DIAGNOSIS — Z8669 Personal history of other diseases of the nervous system and sense organs: Secondary | ICD-10-CM

## 2018-03-14 DIAGNOSIS — R768 Other specified abnormal immunological findings in serum: Secondary | ICD-10-CM | POA: Diagnosis not present

## 2018-03-14 DIAGNOSIS — Z79899 Other long term (current) drug therapy: Secondary | ICD-10-CM | POA: Diagnosis not present

## 2018-03-14 DIAGNOSIS — E559 Vitamin D deficiency, unspecified: Secondary | ICD-10-CM

## 2018-03-14 DIAGNOSIS — L2089 Other atopic dermatitis: Secondary | ICD-10-CM

## 2018-03-14 DIAGNOSIS — Z8709 Personal history of other diseases of the respiratory system: Secondary | ICD-10-CM

## 2018-03-14 DIAGNOSIS — L501 Idiopathic urticaria: Secondary | ICD-10-CM

## 2018-03-14 NOTE — Progress Notes (Signed)
Pharmacy Note  Subjective: Patient presents today to the [facility] to see Dr. Estanislado Pandy.  Patient seen by the pharmacist for counseling on hydroxychloroquine for Sjogren's Syndrome.  She is currently on Restasis for dry eye.  Objective: CMP     Component Value Date/Time   NA 138 01/12/2018 1201   K 3.9 01/12/2018 1201   CL 101 01/12/2018 1201   CO2 25 01/12/2018 1201   GLUCOSE 92 01/12/2018 1201   GLUCOSE 85 09/07/2016 1048   BUN 14 01/12/2018 1201   CREATININE 0.80 01/12/2018 1201   CREATININE 0.90 09/07/2016 1048   CALCIUM 9.1 01/12/2018 1201   PROT 7.7 09/07/2016 1048   ALBUMIN 3.6 09/07/2016 1048   AST 15 09/07/2016 1048   ALT 13 09/07/2016 1048   ALKPHOS 69 09/07/2016 1048   BILITOT 0.3 09/07/2016 1048   GFRNONAA 89 01/12/2018 1201   GFRNONAA 77 09/07/2016 1048   GFRAA 102 01/12/2018 1201   GFRAA 89 09/07/2016 1048    CBC    Component Value Date/Time   WBC 7.7 01/12/2018 1201   WBC 4.4 09/07/2016 1048   RBC 3.90 01/12/2018 1201   RBC 4.07 09/07/2016 1048   HGB 11.3 01/12/2018 1201   HCT 34.2 01/12/2018 1201   PLT 256 01/12/2018 1201   MCV 88 01/12/2018 1201   MCH 29.0 01/12/2018 1201   MCH 27.8 09/07/2016 1048   MCHC 33.0 01/12/2018 1201   MCHC 32.0 09/07/2016 1048   RDW 13.3 01/12/2018 1201   LYMPHSABS 1.7 01/12/2018 1201   MONOABS 396 09/07/2016 1048   EOSABS 0.1 01/12/2018 1201   BASOSABS 0.0 01/12/2018 1201    Assessment/Plan: Patient was counseled on the purpose, proper use, and adverse effects of hydroxychloroquine including nausea/diarrhea, skin rash, headaches, and sun sensitivity.  Discussed importance of annual eye exams while on hydroxychloroquine to monitor to ocular toxicity and discussed importance of frequent laboratory monitoring.  Provided patient with eye exam form for baseline ophthalmologic exam and standing lab instructions.  Provided patient with educational materials on hydroxychloroquine and answered all questions.   Patient consented to hydroxychloroquine.  Will upload consent in the media tab.    Dose will be Plaquenil 200 mg twice daily.  Prescription pending lab results.  Prefers script sent to [facility] on [address].  She is allergic to sulfa drugs.  Instructed patient to take 1/2 tablet for first dose and have Benadryl on hand in case of an allergic reaction.  If she as a reaction call our office and seek urgent medical care if needed.  Patient verbalized understanding.  Reviewed other management strategies to help with symptom of dry mouth as patient can not take pilocarpine or fish oil due to asthma and fish allergy.  They were included in her AVS.  Patient also given prescription for EKG to take to her PCP due to + SSA RO antibody.  All questions encouraged and answered.  Instructed patient to call with any further questions or concerns.  Mariella Saa, PharmD, [facility] Rheumatology Clinical Pharmacist  03/14/2018 10:28 AM

## 2018-03-14 NOTE — Telephone Encounter (Signed)
Dose will be Plaquenil 200 mg twice daily.  Prescription pending lab results.  Prefers script sent to [facility] on [address].

## 2018-03-14 NOTE — Patient Instructions (Addendum)
Recommend getting EKG with PCP due to + SSA RO antibody.  Due to sulfa allergy take 1/2 tablet of Plaquenil for first dose and have Bendaryl on hand in case of allergic reaction. Call our office if you develop a reaction and seek urgent medical care if needed.  Standing Labs We placed an order today for your standing lab work.    Please come back and get your standing labs in 1 month after starting Plaquenil and then 3 months and then every 5 months.  We have open lab Monday through Friday from 8:30-11:30 AM and 1:30-4:00 PM  at the office of Dr. Bo Merino.   You may experience shorter wait times on Monday and Friday afternoons. The office is located at [address] [place], [place], [place] [address] No appointment is necessary.   Labs are drawn by [facility].  You may receive a bill from [facility] for your lab work.  If you wish to have your labs drawn at another location, please call the office 24 hours in advance to send orders.  If you have any questions regarding directions or hours of operation,  please call [phone].   Just as a reminder please drink plenty of water prior to coming for your lab work. Thanks!  Vaccines You are taking a medication(s) that can suppress your immune system.  The following immunizations are recommended: . Flu annually (Flublok for egg allergy) . Pneumonia (Pneumovax 23 and Prevnar 13 spaced at least 1 year apart) . Shingrix  Please check with your PCP to make sure you are up to date.  Treating Dry Mouth  Dry Mouth (also called xerostomia) is a condition that occurs when your body doesn't produce enough saliva. Saliva is an essential body fluid for protection and preservation of the oral cavity and oral functions.  Dry mouth can cause complications such as difficulty swallowing, severe and progressive tooth decay, and oral infections (particularly fungal).   Below are products and other management strategies that can help relieve the  symptoms and reduce complications of dry mouth.  . Keep your mouth moist by sipping small amounts of water during the day. Excessive sips of can reduce the oral mucus film and increase symptoms. . Avoid frequent intake of acidic and caffeinated beverages. . Increase salivary secretion by chewing gum containing no sugar or sucking sugar free hard candies. . Over-the-counter saliva substitutes such as Biotne( Moisturizing Gel, Oral Rinses and Mouth Spray), Oramoist (patches), OraCoat (melts and lozenges).  Biotne also makes non-irritating toothpaste.   Hydroxychloroquine tablets What is this medicine? HYDROXYCHLOROQUINE (hye drox ee KLOR oh kwin) is used to treat rheumatoid arthritis and systemic lupus erythematosus. It is also used to treat malaria. This medicine may be used for other purposes; ask your health care provider or pharmacist if you have questions. COMMON BRAND NAME(S): Plaquenil, Quineprox What should I tell my health care provider before I take this medicine? They need to know if you have any of these conditions: -diabetes -eye disease, vision problems -G6PD deficiency -history of blood diseases -history of irregular heartbeat -if you often drink alcohol -kidney disease -liver disease -porphyria -psoriasis -seizures -an unusual or allergic reaction to chloroquine, hydroxychloroquine, other medicines, foods, dyes, or preservatives -pregnant or trying to get pregnant -breast-feeding How should I use this medicine? Take this medicine by mouth with a glass of water. Follow the directions on the prescription label. Avoid taking antacids within 4 hours of taking this medicine. It is best to separate these medicines by  at least 4 hours. Do not cut, crush or chew this medicine. You can take it with or without food. If it upsets your stomach, take it with food. Take your medicine at regular intervals. Do not take your medicine more often than directed. Take all of your medicine as  directed even if you think you are better. Do not skip doses or stop your medicine early. Talk to your pediatrician regarding the use of this medicine in children. While this drug may be prescribed for selected conditions, precautions do apply. Overdosage: If you think you have taken too much of this medicine contact a poison control center or emergency room at once. NOTE: This medicine is only for you. Do not share this medicine with others. What if I miss a dose? If you miss a dose, take it as soon as you can. If it is almost time for your next dose, take only that dose. Do not take double or extra doses. What may interact with this medicine? Do not take this medicine with any of the following medications: -cisapride -dofetilide -dronedarone -live virus vaccines -penicillamine -pimozide -thioridazine -ziprasidone This medicine may also interact with the following medications: -ampicillin -antacids -cimetidine -cyclosporine -digoxin -medicines for diabetes, like insulin, glipizide, glyburide -medicines for seizures like carbamazepine, phenobarbital, phenytoin -mefloquine -methotrexate -other medicines that prolong the QT interval (cause an abnormal heart rhythm) -praziquantel This list may not describe all possible interactions. Give your health care provider a list of all the medicines, herbs, non-prescription drugs, or dietary supplements you use. Also tell them if you smoke, drink alcohol, or use illegal drugs. Some items may interact with your medicine. What should I watch for while using this medicine? Tell your doctor or healthcare professional if your symptoms do not start to get better or if they get worse. Avoid taking antacids within 4 hours of taking this medicine. It is best to separate these medicines by at least 4 hours. Tell your doctor or health care professional right away if you have any change in your eyesight. Your vision and blood may be tested before and during  use of this medicine. This medicine can make you more sensitive to the sun. Keep out of the sun. If you cannot avoid being in the sun, wear protective clothing and use sunscreen. Do not use sun lamps or tanning beds/booths. What side effects may I notice from receiving this medicine? Side effects that you should report to your doctor or health care professional as soon as possible: -allergic reactions like skin rash, itching or hives, swelling of the face, lips, or tongue -changes in vision -decreased hearing or ringing of the ears -redness, blistering, peeling or loosening of the skin, including inside the mouth -seizures -sensitivity to light -signs and symptoms of a dangerous change in heartbeat or heart rhythm like chest pain; dizziness; fast or irregular heartbeat; palpitations; feeling faint or lightheaded, falls; breathing problems -signs and symptoms of liver injury like dark yellow or brown urine; general ill feeling or flu-like symptoms; light-colored stools; loss of appetite; nausea; right upper belly pain; unusually weak or tired; yellowing of the eyes or skin -signs and symptoms of low blood sugar such as feeling anxious; confusion; dizziness; increased hunger; unusually weak or tired; sweating; shakiness; cold; irritable; headache; blurred vision; fast heartbeat; loss of consciousness -uncontrollable head, mouth, neck, arm, or leg movements Side effects that usually do not require medical attention (report to your doctor or health care professional if they continue or are bothersome): -anxious -diarrhea -  dizziness -hair loss -headache -irritable -loss of appetite -nausea, vomiting -stomach pain This list may not describe all possible side effects. Call your doctor for medical advice about side effects. You may report side effects to FDA at 1-800-FDA-1088. Where should I keep my medicine? Keep out of the reach of children. In children, this medicine can cause overdose with  small doses. Store at room temperature between 15 and 30 degrees C (59 and 86 degrees F). Protect from moisture and light. Throw away any unused medicine after the expiration date. NOTE: This sheet is a summary. It may not cover all possible information. If you have questions about this medicine, talk to your doctor, pharmacist, or health care provider.  2019 Elsevier/Gold Standard (2015-09-23 14:16:15)

## 2018-03-19 ENCOUNTER — Ambulatory Visit (INDEPENDENT_AMBULATORY_CARE_PROVIDER_SITE_OTHER): Payer: PRIVATE HEALTH INSURANCE

## 2018-03-19 DIAGNOSIS — L501 Idiopathic urticaria: Secondary | ICD-10-CM

## 2018-03-19 NOTE — Progress Notes (Signed)
Please start patient on Plaquenil.  It was discussed during the last visit.

## 2018-03-20 MED ORDER — HYDROXYCHLOROQUINE SULFATE 200 MG PO TABS: 200.0000 mg | ORAL_TABLET | Freq: Two times a day (BID) | ORAL | 0 refills | Status: DC

## 2018-03-20 NOTE — Telephone Encounter (Signed)
Labs have resulted and prescription sent to the pharmacy

## 2018-03-20 NOTE — Addendum Note (Signed)
Addended by: Carole Binning on: 03/20/2018 01:41 PM   Modules accepted: Orders

## 2018-03-21 LAB — TSH: TSH: 0.87 mIU/L

## 2018-03-21 LAB — HEPATITIS C ANTIBODY
Hepatitis C Ab: NONREACTIVE
SIGNAL TO CUT-OFF: 0.06 (ref <1.00)

## 2018-03-21 LAB — BETA-2 GLYCOPROTEIN ANTIBODIES
Beta-2 Glyco 1 IgA: <9 SAU (ref <=20)
Beta-2 Glyco 1 IgM: <9 SMU (ref <=20)

## 2018-03-21 LAB — URINALYSIS, ROUTINE W REFLEX MICROSCOPIC
Bilirubin Urine: NEGATIVE
GLUCOSE, UA: NEGATIVE
HGB URINE DIPSTICK: NEGATIVE
Ketones, ur: NEGATIVE
Leukocytes, UA: NEGATIVE
Nitrite: NEGATIVE
Protein, ur: NEGATIVE
Specific Gravity, Urine: 1.019 (ref 1.001–1.03)
pH: 5.5 (ref 5.0–8.0)

## 2018-03-21 LAB — COMPLETE METABOLIC PANEL WITH GFR
AG Ratio: 0.8 (calc) — ABNORMAL LOW (ref 1.0–2.5)
ALBUMIN MSPROF: 3.8 g/dL (ref 3.6–5.1)
ALT: 14 U/L (ref 6–29)
AST: 21 U/L (ref 10–35)
Alkaline phosphatase (APISO): 57 U/L (ref 33–115)
BUN: 12 mg/dL (ref 7–25)
CALCIUM: 9.1 mg/dL (ref 8.6–10.2)
CO2: 21 mmol/L (ref 20–32)
Chloride: 102 mmol/L (ref 98–110)
Creat: 0.87 mg/dL (ref 0.50–1.10)
GFR, Est African American: 93 mL/min/{1.73_m2} (ref >=60)
GFR, Est Non African American: 80 mL/min/{1.73_m2} (ref >=60)
GLUCOSE: 75 mg/dL (ref 65–99)
Globulin: 4.5 g/dL (calc) — ABNORMAL HIGH (ref 1.9–3.7)
POTASSIUM: 3.9 mmol/L (ref 3.5–5.3)
SODIUM: 135 mmol/L (ref 135–146)
TOTAL PROTEIN: 8.3 g/dL — AB (ref 6.1–8.1)
Total Bilirubin: 0.3 mg/dL (ref 0.2–1.2)

## 2018-03-21 LAB — ANA: Anti Nuclear Antibody(ANA): POSITIVE — AB

## 2018-03-21 LAB — SEDIMENTATION RATE: Sed Rate: 36 mm/h — ABNORMAL HIGH (ref 0–20)

## 2018-03-21 LAB — QUANTIFERON-TB GOLD PLUS
Mitogen-NIL: >10 IU/mL
NIL: 0.02 [IU]/mL
QUANTIFERON-TB GOLD PLUS: NEGATIVE
TB1-NIL: 0 IU/mL
TB2-NIL: 0 IU/mL

## 2018-03-21 LAB — CARDIOLIPIN ANTIBODIES, IGG, IGM, IGA: Anticardiolipin IgM: <12 [MPL'U]

## 2018-03-21 LAB — IGG, IGA, IGM
IgG (Immunoglobin G), Serum: 2617 mg/dL — ABNORMAL HIGH (ref 600–1640)
IgM, Serum: 124 mg/dL (ref 50–300)
Immunoglobulin A: 692 mg/dL — ABNORMAL HIGH (ref 47–310)

## 2018-03-21 LAB — PROTEIN ELECTROPHORESIS, SERUM, WITH REFLEX
Albumin ELP: 3.7 g/dL — ABNORMAL LOW (ref 3.8–4.8)
Alpha 1: 0.3 g/dL (ref 0.2–0.3)
Alpha 2: 0.5 g/dL (ref 0.5–0.9)
Beta 2: 0.6 g/dL — ABNORMAL HIGH (ref 0.2–0.5)
Beta Globulin: 0.4 g/dL (ref 0.4–0.6)
Gamma Globulin: 2.5 g/dL — ABNORMAL HIGH (ref 0.8–1.7)
Total Protein: 8 g/dL (ref 6.1–8.1)

## 2018-03-21 LAB — ANTI-SCLERODERMA ANTIBODY: Scleroderma (Scl-70) (ENA) Antibody, IgG: <1 AI

## 2018-03-21 LAB — HIV ANTIBODY (ROUTINE TESTING W REFLEX): HIV 1&2 Ab, 4th Generation: NONREACTIVE

## 2018-03-21 LAB — LUPUS ANTICOAGULANT EVAL W/ REFLEX
PTT-LA SCREEN: 30 s (ref <=40)
dRVVT: 36 s (ref <=45)

## 2018-03-21 LAB — ANTI-NUCLEAR AB-TITER (ANA TITER): ANA Titer 1: 1:1280 {titer} — ABNORMAL HIGH

## 2018-03-21 LAB — GLUCOSE 6 PHOSPHATE DEHYDROGENASE: G-6PDH: 17 U/g{Hb} (ref 7.0–20.5)

## 2018-03-21 LAB — C3 AND C4
C3 Complement: 135 mg/dL (ref 83–193)
C4 Complement: 18 mg/dL (ref 15–57)

## 2018-03-21 LAB — THIOPURINE METHYLTRANSFERASE (TPMT), RBC: Thiopurine Methyltransferase, RBC: 14 nmol/hr/mL RBC

## 2018-03-21 LAB — HEPATITIS B CORE ANTIBODY, IGM: Hep B C IgM: NONREACTIVE

## 2018-03-21 LAB — CK: Total CK: 67 U/L (ref 29–143)

## 2018-03-21 LAB — HEPATITIS B SURFACE ANTIGEN: Hepatitis B Surface Ag: NONREACTIVE

## 2018-03-28 DIAGNOSIS — Z79899 Other long term (current) drug therapy: Secondary | ICD-10-CM | POA: Insufficient documentation

## 2018-03-28 DIAGNOSIS — M3501 Sicca syndrome with keratoconjunctivitis: Secondary | ICD-10-CM | POA: Insufficient documentation

## 2018-03-28 NOTE — Progress Notes (Signed)
Office Visit Note  Patient: Misty Barnes             Date of Birth: Sep 05, 1970           MRN: [id number]             PCP: Harlan Stains, MD Referring: Harlan Stains, MD Visit Date: 04/11/2018 Occupation: @GUAROCC @  Subjective:  Dry mouth and dry eyes.   History of Present Illness: Misty Barnes is a 48 y.o. female with history of myofascial pain syndrome and Sjogren's syndrome.  She states she has been on Plaquenil for 1-1/2-week now.  She has not noticed any difference so far.  She has been using over-the-counter products including Biotene and eyedrops which is been helpful.  He states that her urticaria resolved about 3 days ago.  She has not had her eye exam yet.  Which is a scheduled for February 25.  She states her PCP does not accept her insurance and she has to establish with a new PCP now.  She has not had EKG yet.  Activities of Daily Living:  Patient reports morning stiffness for 0 minutes.   Patient Denies nocturnal pain.  Difficulty dressing/grooming: Denies Difficulty climbing stairs: Denies Difficulty getting out of chair: Denies Difficulty using hands for taps, buttons, cutlery, and/or writing: Denies  Review of Systems  Constitutional: Negative for fatigue, night sweats, weight gain and weight loss.  HENT: Positive for mouth dryness. Negative for mouth sores, trouble swallowing, trouble swallowing and nose dryness.   Eyes: Positive for dryness. Negative for pain, redness and visual disturbance.  Respiratory: Negative for cough, shortness of breath and difficulty breathing.   Cardiovascular: Negative for chest pain, palpitations, hypertension, irregular heartbeat and swelling in legs/feet.  Gastrointestinal: Negative for blood in stool, constipation and diarrhea.  Endocrine: Positive for excessive thirst. Negative for increased urination.  Genitourinary: Negative for difficulty urinating and vaginal dryness.  Musculoskeletal: Negative for arthralgias, gait  problem, joint pain, joint swelling, myalgias, muscle weakness, morning stiffness, muscle tenderness and myalgias.  Skin: Negative for color change, rash, hair loss, skin tightness, ulcers and sensitivity to sunlight.  Allergic/Immunologic: Negative for susceptible to infections.  Neurological: Negative for dizziness, memory loss, night sweats and weakness.  Hematological: Negative for bruising/bleeding tendency and swollen glands.  Psychiatric/Behavioral: Positive for sleep disturbance. Negative for depressed mood. The patient is not nervous/anxious.     PMFS History:  Patient Active Problem List   Diagnosis Date Noted  . Sjogren's syndrome with keratoconjunctivitis sicca ([facility]) 03/28/2018  . High risk medication use 03/28/2018  . Idiopathic urticaria 01/12/2018  . Flexural atopic dermatitis 12/31/2015  . Food allergy 12/31/2015  . Allergic rhinoconjunctivitis 12/31/2015  . Moderate persistent asthma without complication 82/64/1583  . Obesity (BMI 35.0-39.9 without comorbidity) 12/15/2011  . Breast mass, right 10/26/2011  . Hidradenitis suppurativa of right axilla 10/26/2011  . Myofascial muscle pain 05/10/2011  . Cervicalgia 05/10/2011  . Intractable migraine with aura 05/10/2011  . Vitamin D deficiency 05/10/2011    Past Medical History:  Diagnosis Date  . Asthma   . Chronic kidney disease    stones  . Eczema   . Headache(784.0)    migraines  . Hypertension   . PONV (postoperative nausea and vomiting)     Family History  Problem Relation Age of Onset  . Hypertension Father   . Multiple sclerosis Mother   . Allergic rhinitis Paternal Grandmother   . Asthma Paternal Grandmother   . Allergic rhinitis Paternal Aunt   .  Asthma Paternal Aunt   . Allergies Brother   . Healthy Brother   . Healthy Son   . Healthy Daughter    Past Surgical History:  Procedure Laterality Date  . ABDOMINAL HYSTERECTOMY    . CESAREAN SECTION     x 2  . CYSTOSCOPY  01/10/2011   Procedure:  CYSTOSCOPY;  Surgeon: Lubertha South Romine;  Location: [facility];  Service: Gynecology;  Laterality: N/A;  . ENDOMETRIAL ABLATION  2005  . HERNIA REPAIR    . LIPOMA EXCISION     cs   Social History   Social History Narrative  . Not on file    There is no immunization history on file for this patient.   Objective: Vital Signs: BP (!) 102/57 (BP Location: Left Arm, Patient Position: Sitting, Cuff Size: Normal)   Pulse 87   Resp 16   Ht 5' 5.5" (1.664 m)   Wt 250 lb (113.4 kg)   LMP 11/07/2010   BMI 40.97 kg/m    Physical Exam Vitals signs and nursing note reviewed.  Constitutional:      Appearance: She is well-developed.  HENT:     Head: Normocephalic and atraumatic.  Eyes:     Conjunctiva/sclera: Conjunctivae normal.  Neck:     Musculoskeletal: Normal range of motion.  Cardiovascular:     Rate and Rhythm: Normal rate and regular rhythm.     Heart sounds: Normal heart sounds.  Pulmonary:     Effort: Pulmonary effort is normal.     Breath sounds: Normal breath sounds.  Abdominal:     General: Bowel sounds are normal.     Palpations: Abdomen is soft.  Lymphadenopathy:     Cervical: No cervical adenopathy.  Skin:    General: Skin is warm and dry.     Capillary Refill: Capillary refill takes less than 2 seconds.     Comments: Postinflammatory scarring was noted on face extremities and trunk.  She has some resolving macular rash.  She also has some excoriation marks.  Neurological:     Mental Status: She is alert and oriented to person, place, and time.  Psychiatric:        Behavior: Behavior normal.      Musculoskeletal Exam: C-spine, thoracic and lumbar spine good range of motion.  Shoulder joints elbow joints wrist joint MCPs PIPs DIPs been good range of motion with no synovitis.  Hip joints knee joints ankles MTPs PIPs DIPs been good range of motion with no synovitis.  CDAI Exam: CDAI Score: Not documented Patient Global Assessment: Not documented; Provider Global  Assessment: Not documented Swollen: Not documented; Tender: Not documented Joint Exam   Not documented   There is currently no information documented on the homunculus. Go to the Rheumatology activity and complete the homunculus joint exam.  Investigation: No additional findings.  Imaging: No results found.  Recent Labs: Lab Results  Component Value Date   WBC 7.7 01/12/2018   HGB 11.3 01/12/2018   PLT 256 01/12/2018   NA 135 03/14/2018   K 3.9 03/14/2018   CL 102 03/14/2018   CO2 21 03/14/2018   GLUCOSE 75 03/14/2018   BUN 12 03/14/2018   CREATININE 0.87 03/14/2018   BILITOT 0.3 03/14/2018   ALKPHOS 69 09/07/2016   AST 21 03/14/2018   ALT 14 03/14/2018   PROT 8.3 (H) 03/14/2018   PROT 8.0 03/14/2018   ALBUMIN 3.6 09/07/2016   CALCIUM 9.1 03/14/2018   GFRAA 93 03/14/2018   QFTBGOLDPLUS NEGATIVE 03/14/2018  UA negative, SPEP negative, TB Gold negative, IgG elevated, hepatitis B-, hepatitis C negative, HIV negative, G6PD normal, T PMT normal, CK 67, TSH normal ANA 1: 1280 speckled, SCL 70-, C3 normal, C4 normal, anticardiolipin negative, beta-2 GP 1-, lupus anticoagulant negative ESR 36  01/12/18: ANA+, CRP 13, sed rate 42, dsDNA-, RNP 0.3, smith 0.2, Ro >8, La>8  Speciality Comments: No specialty comments available.  Procedures:  No procedures performed Allergies: Fish allergy; Penicillins; Percocet [oxycodone-acetaminophen]; and Sulfa antibiotics   Assessment / Plan:     Visit Diagnoses: Sjogren's syndrome with keratoconjunctivitis sicca ([facility]) - ANA 1: 1280 speckled, positive Ro, positive La.  Severe sicca symptoms.  Patient has been on Plaquenil for 1-1/2-week only and has not noticed much difference except for improvement in her rash.  She has been using over-the-counter products for sicca symptoms which is been helpful.  Plan to check rheumatoid factor with next labs.  High risk medication use - Patient was started on 03/2018  Plaquenil 200 mg p.o. twice daily  after the last visit.  We will check labs in a month and then every 3 months.  She been advised to get eye examination which is scheduled this month.  She will also establish new PCP and get EKG.  Idiopathic urticaria-improved on Plaquenil.  Have also advised to use sunscreen on a regular basis.  Myofascial muscle pain-she continues to have some generalized pain and discomfort.  Vitamin D deficiency-she is on vitamin D supplement.  Obesity (BMI 35.0-39.9 without comorbidity)-weight loss diet and exercise was discussed.  I gave her information on the weight loss clinic.  Moderate persistent asthma without complication  History of migraine   Orders: Orders Placed This Encounter  Procedures  . Rheumatoid factor   No orders of the defined types were placed in this encounter.     Follow-Up Instructions: Return in about 3 months (around 07/10/2018) for Sjogren's.   Bo Merino, MD  Note - This record has been created using Editor, commissioning.  Chart creation errors have been sought, but may not always  have been located. Such creation errors do not reflect on  the standard of medical care.

## 2018-04-11 ENCOUNTER — Encounter: Payer: Self-pay | Admitting: Rheumatology

## 2018-04-11 ENCOUNTER — Ambulatory Visit: Payer: PRIVATE HEALTH INSURANCE | Admitting: Rheumatology

## 2018-04-11 VITALS — BP 102/57 | HR 87 | Resp 16 | Ht 65.5 in | Wt 250.0 lb

## 2018-04-11 DIAGNOSIS — M3501 Sicca syndrome with keratoconjunctivitis: Secondary | ICD-10-CM

## 2018-04-11 DIAGNOSIS — E669 Obesity, unspecified: Secondary | ICD-10-CM

## 2018-04-11 DIAGNOSIS — J4540 Moderate persistent asthma, uncomplicated: Secondary | ICD-10-CM

## 2018-04-11 DIAGNOSIS — Z79899 Other long term (current) drug therapy: Secondary | ICD-10-CM | POA: Diagnosis not present

## 2018-04-11 DIAGNOSIS — Z8669 Personal history of other diseases of the nervous system and sense organs: Secondary | ICD-10-CM

## 2018-04-11 DIAGNOSIS — E559 Vitamin D deficiency, unspecified: Secondary | ICD-10-CM

## 2018-04-11 DIAGNOSIS — M7918 Myalgia, other site: Secondary | ICD-10-CM | POA: Diagnosis not present

## 2018-04-11 DIAGNOSIS — L501 Idiopathic urticaria: Secondary | ICD-10-CM | POA: Diagnosis not present

## 2018-04-11 NOTE — Patient Instructions (Signed)
Standing Labs We placed an order today for your standing lab work.    Please come back and get your standing labs in March and every 3 months  We have open lab Monday through Friday from 8:30-11:30 AM and 1:30-4:00 PM  at the office of Dr. Alanni Vader.   You may experience shorter wait times on Monday and Friday afternoons. The office is located at [address], [address], [place], [place] [address] No appointment is necessary.   Labs are drawn by [facility].  You may receive a bill from [facility] for your lab work.  If you wish to have your labs drawn at another location, please call the office 24 hours in advance to send orders.  If you have any questions regarding directions or hours of operation,  please call [phone].   Just as a reminder please drink plenty of water prior to coming for your lab work. Thanks!  

## 2018-04-16 ENCOUNTER — Ambulatory Visit (INDEPENDENT_AMBULATORY_CARE_PROVIDER_SITE_OTHER): Payer: PRIVATE HEALTH INSURANCE | Admitting: *Deleted

## 2018-04-16 DIAGNOSIS — L501 Idiopathic urticaria: Secondary | ICD-10-CM

## 2018-04-23 ENCOUNTER — Telehealth: Payer: Self-pay | Admitting: *Deleted

## 2018-04-23 ENCOUNTER — Telehealth: Payer: Self-pay | Admitting: Physician Assistant

## 2018-04-23 NOTE — Progress Notes (Signed)
Office Visit Note  Patient: Misty Barnes             Date of Birth: 02-Mar-1970           MRN: [id number]             PCP: Harlan Stains, MD Referring: Harlan Stains, MD Visit Date: 04/24/2018 Occupation: @GUAROCC @  Subjective:  Rash   History of Present Illness: Misty Barnes is a 48 y.o. female with history of Sjogren's syndrome, idiopathic urticaria, and myofascial pain.  She presented yesterday to our office as a walk-in while Dr. Estanislado Pandy was out of the office.  She developed a rash on Friday that started on her hands and spread to her arms and torso.   She woke up yesterday and the rash was significantly worse.  She was in significant discomfort and itching. She also noticed weeping of the rash.   She has been on PLQ for about 1 month.  She reports initially when starting PLQ she had hives on her chest which resolved with topical agents.  Her last dose of PLQ was yesterday morning. She denies changing products, introducing any other new medications, or foods. She was evaluated yesterday by Dr. Nevada Crane who started her on a prednisone taper, performed a DepoMedrol IM injection, prescribed Vistaril, and topical triamcinolone.  She will follow up on Monday. She reports this is the most severe rash she has experienced.  She has missed several days of work.   She denies any sores in mouth or nose.  She denies any fevers. She continues to have sicca symptoms and did not notice any improvement on PLQ.  She denies any joint pain or joint swelling.  She denies any muscle aches or muscle tenderness.     Activities of Daily Living:  Patient reports morning stiffness for 0 minutes.   Patient Denies nocturnal pain.  Difficulty dressing/grooming: Denies Difficulty climbing stairs: Denies Difficulty getting out of chair: Denies Difficulty using hands for taps, buttons, cutlery, and/or writing: Denies  Review of Systems  Constitutional: Negative for fatigue.  HENT: Positive for mouth dryness.  Negative for mouth sores and nose dryness.   Eyes: Positive for dryness. Negative for pain and visual disturbance.  Respiratory: Negative for cough, hemoptysis, shortness of breath and difficulty breathing.   Cardiovascular: Negative for chest pain, palpitations, hypertension and swelling in legs/feet.  Gastrointestinal: Negative for blood in stool, constipation and diarrhea.  Endocrine: Negative for increased urination.  Genitourinary: Negative for painful urination.  Musculoskeletal: Negative for arthralgias, joint pain, joint swelling, myalgias, muscle weakness, morning stiffness, muscle tenderness and myalgias.  Skin: Positive for rash. Negative for color change, pallor, hair loss, nodules/bumps, skin tightness, ulcers and sensitivity to sunlight.  Allergic/Immunologic: Negative for susceptible to infections.  Neurological: Positive for dizziness (SE of PLQ). Negative for numbness, headaches and weakness.  Hematological: Negative for swollen glands.  Psychiatric/Behavioral: Negative for depressed mood and sleep disturbance. The patient is not nervous/anxious.     PMFS History:  Patient Active Problem List   Diagnosis Date Noted  . Sjogren's syndrome with keratoconjunctivitis sicca ([place]) 03/28/2018  . High risk medication use 03/28/2018  . Idiopathic urticaria 01/12/2018  . Flexural atopic dermatitis 12/31/2015  . Food allergy 12/31/2015  . Allergic rhinoconjunctivitis 12/31/2015  . Moderate persistent asthma without complication 25/42/7062  . Obesity (BMI 35.0-39.9 without comorbidity) 12/15/2011  . Breast mass, right 10/26/2011  . Hidradenitis suppurativa of right axilla 10/26/2011  . Myofascial muscle pain 05/10/2011  . Cervicalgia 05/10/2011  .  Intractable migraine with aura 05/10/2011  . Vitamin D deficiency 05/10/2011    Past Medical History:  Diagnosis Date  . Asthma   . Chronic kidney disease    stones  . Eczema   . Headache(784.0)    migraines  . Hypertension   .  PONV (postoperative nausea and vomiting)     Family History  Problem Relation Age of Onset  . Hypertension Father   . Multiple sclerosis Mother   . Allergic rhinitis Paternal Grandmother   . Asthma Paternal Grandmother   . Allergic rhinitis Paternal Aunt   . Asthma Paternal Aunt   . Allergies Brother   . Healthy Brother   . Healthy Son   . Healthy Daughter    Past Surgical History:  Procedure Laterality Date  . ABDOMINAL HYSTERECTOMY    . CESAREAN SECTION     x 2  . CYSTOSCOPY  01/10/2011   Procedure: CYSTOSCOPY;  Surgeon: Lubertha South Romine;  Location: [facility];  Service: Gynecology;  Laterality: N/A;  . ENDOMETRIAL ABLATION  2005  . HERNIA REPAIR    . LIPOMA EXCISION     cs   Social History   Social History Narrative  . Not on file    There is no immunization history on file for this patient.   Objective: Vital Signs: BP 103/62 (BP Location: Left Arm, Patient Position: Sitting, Cuff Size: Large)   Pulse 93   Resp 16   Ht 5' 5.5" (1.664 m)   Wt 250 lb (113.4 kg)   LMP 11/07/2010   BMI 40.97 kg/m    Physical Exam Vitals signs and nursing note reviewed.  Constitutional:      Appearance: She is well-developed.  HENT:     Head: Normocephalic and atraumatic.  Eyes:     Conjunctiva/sclera: Conjunctivae normal.  Neck:     Musculoskeletal: Normal range of motion.  Cardiovascular:     Rate and Rhythm: Normal rate and regular rhythm.     Heart sounds: Normal heart sounds.  Pulmonary:     Effort: Pulmonary effort is normal.     Breath sounds: Normal breath sounds.  Abdominal:     General: Bowel sounds are normal.     Palpations: Abdomen is soft.  Lymphadenopathy:     Cervical: No cervical adenopathy.  Skin:    General: Skin is warm and dry.     Capillary Refill: Capillary refill takes less than 2 seconds.     Comments: Maculopapular rash on bilateral upper extremities and torso.  Post-inflammatory scarring noted on face. Facial edema noted.  Neurological:      Mental Status: She is alert and oriented to person, place, and time.  Psychiatric:        Behavior: Behavior normal.      Musculoskeletal Exam: C-spine, thoracic spine, lumbar spine good range of motion.  No midline spinal tenderness.  No SI joint tenderness.  Shoulder joints, elbow joints, wrist joints, MCPs, PIPs, DIPs good range of motion with no synovitis.  She has complete fist formation bilaterally.  Hip joints, knee joints, ankle joints, MTPs, PIPs, DIPs good range of motion with no synovitis.  No warmth or effusion bilateral knee joints.  No tender swelling of ankle joints.  CDAI Exam: CDAI Score: Not documented Patient Global Assessment: Not documented; Provider Global Assessment: Not documented Swollen: Not documented; Tender: Not documented Joint Exam   Not documented   There is currently no information documented on the homunculus. Go to the Rheumatology activity and complete  the homunculus joint exam.  Investigation: No additional findings.  Imaging: No results found.  Recent Labs: Lab Results  Component Value Date   WBC 7.7 01/12/2018   HGB 11.3 01/12/2018   PLT 256 01/12/2018   NA 135 03/14/2018   K 3.9 03/14/2018   CL 102 03/14/2018   CO2 21 03/14/2018   GLUCOSE 75 03/14/2018   BUN 12 03/14/2018   CREATININE 0.87 03/14/2018   BILITOT 0.3 03/14/2018   ALKPHOS 69 09/07/2016   AST 21 03/14/2018   ALT 14 03/14/2018   PROT 8.3 (H) 03/14/2018   PROT 8.0 03/14/2018   ALBUMIN 3.6 09/07/2016   CALCIUM 9.1 03/14/2018   GFRAA 93 03/14/2018   QFTBGOLDPLUS NEGATIVE 03/14/2018    Speciality Comments: No specialty comments available.  Procedures:  No procedures performed Allergies: Fish allergy; Penicillins; Percocet [oxycodone-acetaminophen]; Plaquenil [hydroxychloroquine sulfate]; and Sulfa antibiotics   Assessment / Plan:     Visit Diagnoses: Sjogren's syndrome with keratoconjunctivitis sicca ([facility]) - ANA 1: 1280 speckled, positive Ro, positive La, sicca:  She continues to have severe sicca symptoms.  She was started on Plaquenil about 1 month ago.  She has not noticed any improvement in sicca symptoms and starting on Plaquenil.  Her last dose of Plaquenil was yesterday morning.  She has developed a maculopapular rash on her upper extremities and torso that began Friday.  She was advised to discontinue Plaquenil at this time.  We will place a referral to [facility] for further evaluation.  High risk medication use - She started on PLQ at the beginning of February 2020.  Her last dose of PLQ was yesterday morning.  D/c due to rash.   Idiopathic urticaria: She follows up with the [facility].  She is receiving Xolair injections every 4 weeks.   Rash and other nonspecific skin eruption: She has a maculopapular rash on the dorsal aspect of both hands, upper extremities, and torso that started on Friday.  She was started on PLQ about 1 month ago.  She receives Xolair injections every 4 weeks for treatment of chronic urticaria-last injection 04/16/18. She has not introduced any new products, medications, or foods.  She presented to our clinic yesterday as a walk-in while Dr. Estanislado Pandy was out of the office.  Due to the severity of the rash she was advised to go directly to the ED.  Due to long wait times she was not seen at the ED or urgent care.  She was evaluated her Dr. Nevada Crane (dermatology) yesterday.  She was started on a prednisone taper, received a kenalog 40 mg IM, prescribed Vistaril 25 mg, and topical triamcinolone. Prednisone taper 5 mg tablets 7,6,5,4,3,2,1.  No biopsy was performed. She is feeling better and the rash has started to improve.  She will be following up with Dr. Nevada Crane on Monday.  I reached out to her allergist yesterday who attempted to schedule an urgent appointment as well.  We will refer to [facility] for further evaluation.   Myofascial muscle pain: She has no  muscle aches and muscle tenderness at this time.   Other medical conditions are listed as follows:   Moderate persistent asthma without complication  Obesity (BMI 35.0-39.9 without comorbidity)  Vitamin D deficiency  History of migraine    Orders: Orders Placed This Encounter  Procedures  . Ambulatory referral to Dermatology  . Ambulatory referral to Rheumatology   No orders of the defined types  were placed in this encounter.   Face-to-face time spent with patient was 30 minutes. Greater than 50% of time was spent in counseling and coordination of care.  Follow-Up Instructions: Return in about 6 months (around 10/25/2018) for Sjogren's syndrome.   Ofilia Neas, PA-C   I examined and evaluated the patient with Hazel Sams PA.  Patient has history of urticaria and yesterday presented with severe rash and hives.  She was seen by Dr. Nevada Crane.  I am uncertain if her symptoms are associated with positive ANA or Ro antibody.  She has never had a skin biopsy.  I have advised her to discontinue Plaquenil in case she had a Plaquenil reaction.  She would like a referral to [facility] for evaluation.  I will refer her to [facility] rheumatology and dermatology.  The plan of care was discussed as noted above.  Bo Merino, MD  Note - This record has been created using Editor, commissioning.  Chart creation errors have been sought, but may not always  have been located. Such creation errors do not reflect on  the standard of medical care.

## 2018-04-23 NOTE — Telephone Encounter (Signed)
Patient presented to the clinic as a walk-in without an appointment around 2:35 pm. She states she noticed a rash develop on both hands on Friday and it has spread to both upper extremities and torso. She woke up this morning and the rash was significantly worse.  She has noticed raised lesions that have started "weeping."  She is in significant discomfort. Facial edema noted. She is not experiencing any shortness of breath or difficulty breathing at this time.   She was started on Plaquenil 200 mg po BID almost 1 month ago.  Her last dose was this morning.  She had a xolair injection on 04/16/18 for treatment of chronic idiopathic urticaria  She denies having a reaction like this in the past to PLQ or Xolair.    She was advised to stop taking Plaquenil.  I attempted to contact Dr. Estanislado Pandy who is out of the office but I was unable to reach her. I will continue to try to contact her.  The patient was scheduled for an office visit with Dr. Estanislado Pandy for tomorrow at 8 am. Due to the severity of the rash, I advised her to go directly to the emergency department for evaluation and treatment.

## 2018-04-23 NOTE — Telephone Encounter (Signed)
Left a message to return call to schedule apt with anne ambs.

## 2018-04-23 NOTE — Telephone Encounter (Signed)
Attempted to contact patient and left message on machine inquiring if patient has gone for an evaluation. Advised patient to call the office.

## 2018-04-23 NOTE — Telephone Encounter (Signed)
-----   Message from [name], FNP sent at 04/23/2018  4:51 PM EST ----- Can you please call and make an appointment for urgent follow up as soon as possible. Thank you

## 2018-04-23 NOTE — Telephone Encounter (Signed)
Thank you for this information. We will get her into our office as soon as possible for further evaluation.

## 2018-04-24 ENCOUNTER — Ambulatory Visit (INDEPENDENT_AMBULATORY_CARE_PROVIDER_SITE_OTHER): Payer: PRIVATE HEALTH INSURANCE | Admitting: Rheumatology

## 2018-04-24 ENCOUNTER — Encounter: Payer: Self-pay | Admitting: Rheumatology

## 2018-04-24 VITALS — BP 103/62 | HR 93 | Resp 16 | Ht 65.5 in | Wt 250.0 lb

## 2018-04-24 DIAGNOSIS — Z8669 Personal history of other diseases of the nervous system and sense organs: Secondary | ICD-10-CM

## 2018-04-24 DIAGNOSIS — Z79899 Other long term (current) drug therapy: Secondary | ICD-10-CM

## 2018-04-24 DIAGNOSIS — M7918 Myalgia, other site: Secondary | ICD-10-CM

## 2018-04-24 DIAGNOSIS — M3501 Sicca syndrome with keratoconjunctivitis: Secondary | ICD-10-CM | POA: Diagnosis not present

## 2018-04-24 DIAGNOSIS — E669 Obesity, unspecified: Secondary | ICD-10-CM

## 2018-04-24 DIAGNOSIS — L501 Idiopathic urticaria: Secondary | ICD-10-CM

## 2018-04-24 DIAGNOSIS — E559 Vitamin D deficiency, unspecified: Secondary | ICD-10-CM

## 2018-04-24 DIAGNOSIS — R21 Rash and other nonspecific skin eruption: Secondary | ICD-10-CM

## 2018-04-24 DIAGNOSIS — J4540 Moderate persistent asthma, uncomplicated: Secondary | ICD-10-CM

## 2018-04-24 DIAGNOSIS — R768 Other specified abnormal immunological findings in serum: Secondary | ICD-10-CM

## 2018-04-24 NOTE — Telephone Encounter (Signed)
Left message x2.

## 2018-04-25 ENCOUNTER — Ambulatory Visit: Payer: PRIVATE HEALTH INSURANCE | Admitting: Family Medicine

## 2018-04-25 NOTE — Progress Notes (Deleted)
   [address] [place] [address] Dept: [phone]  FOLLOW UP NOTE  Patient ID: Misty Barnes, female    DOB: 1970/03/14  Age: 48 y.o. MRN: [id number] Date of Office Visit: 04/25/2018  Assessment  Chief Complaint: No chief complaint on file.  HPI Misty Barnes is a 48 year old female who presents to the clinic for a follow up of a skin reaction that occurred on Friday. She was last seen in this clinic on 01/12/2018 by Gareth Morgan, NP. At that time, she continued Xolair injections once a month for control of chronic urticaria. She received a total of 4 Xolair injections. Due to elevated inflammatory markers, she was referred to rheumatology and subsequently started on Plaquenil.    Drug Allergies:  Allergies  Allergen Reactions  . Fish Allergy Anaphylaxis  . Penicillins Anaphylaxis  . Percocet [Oxycodone-Acetaminophen] Nausea And Vomiting  . Plaquenil [Hydroxychloroquine Sulfate]     rash  . Sulfa Antibiotics     Unknown reaction    Physical Exam: LMP 11/07/2010    Physical Exam  Diagnostics:    Assessment and Plan: No diagnosis found.  No orders of the defined types were placed in this encounter.   There are no Patient Instructions on file for this visit.  No follow-ups on file.    Thank you for the opportunity to care for this patient.  Please do not hesitate to contact me with questions.  Gareth Morgan, FNP [facility]

## 2018-04-25 NOTE — Telephone Encounter (Signed)
Pt has office visit today.

## 2018-04-27 ENCOUNTER — Ambulatory Visit: Payer: PRIVATE HEALTH INSURANCE | Admitting: Family Medicine

## 2018-04-27 DIAGNOSIS — J309 Allergic rhinitis, unspecified: Secondary | ICD-10-CM

## 2018-04-27 NOTE — Progress Notes (Deleted)
   [address] [place] [address] Dept: [phone]  FOLLOW UP NOTE  Patient ID: Misty Barnes, female    DOB: 1970-03-16  Age: 48 y.o. MRN: [id number] Date of Office Visit: 04/27/2018  Assessment  Chief Complaint: No chief complaint on file.  HPI Misty Barnes is a 48 year old female who presents to the clinic for a follow up of a possible allergic reaction. She was last seen in this clinic on 01/13/2019 by Gareth Morgan for evaluation of asthma, chronic urticaria, and allergic rhinitis. At that time, she restarted Xolair injections 300 mg once every 4 weeks and was referred wo Rheumatology specialty for further evaluation based on elevated lab levels. About 1 week age it was reported that she had experienced some hives and swelling. Our clinic requested an appointment to evaluate her current health and medications.    Drug Allergies:  Allergies  Allergen Reactions  . Fish Allergy Anaphylaxis  . Penicillins Anaphylaxis  . Percocet [Oxycodone-Acetaminophen] Nausea And Vomiting  . Plaquenil [Hydroxychloroquine Sulfate]     rash  . Sulfa Antibiotics     Unknown reaction    Physical Exam: LMP 11/07/2010    Physical Exam  Diagnostics:    Assessment and Plan: No diagnosis found.  No orders of the defined types were placed in this encounter.   There are no Patient Instructions on file for this visit.  No follow-ups on file.    Thank you for the opportunity to care for this patient.  Please do not hesitate to contact me with questions.  Gareth Morgan, FNP [facility]

## 2018-04-30 ENCOUNTER — Telehealth: Payer: Self-pay | Admitting: Rheumatology

## 2018-04-30 NOTE — Telephone Encounter (Signed)
Patient called stating she missed a call from our office.   Patient requested a return call.

## 2018-04-30 NOTE — Telephone Encounter (Signed)
Patient advised that Ivin Booty was trying to reach her to advise her Dr. Karl Pock office. Patient provided with number to contact office.

## 2018-05-14 ENCOUNTER — Ambulatory Visit (INDEPENDENT_AMBULATORY_CARE_PROVIDER_SITE_OTHER): Payer: PRIVATE HEALTH INSURANCE

## 2018-05-14 ENCOUNTER — Other Ambulatory Visit: Payer: Self-pay

## 2018-05-14 DIAGNOSIS — L501 Idiopathic urticaria: Secondary | ICD-10-CM

## 2018-06-11 ENCOUNTER — Ambulatory Visit: Payer: Self-pay

## 2018-06-15 ENCOUNTER — Encounter: Payer: Self-pay | Admitting: Family Medicine

## 2018-06-15 ENCOUNTER — Other Ambulatory Visit: Payer: Self-pay

## 2018-06-15 ENCOUNTER — Ambulatory Visit (INDEPENDENT_AMBULATORY_CARE_PROVIDER_SITE_OTHER): Payer: PRIVATE HEALTH INSURANCE | Admitting: Family Medicine

## 2018-06-15 DIAGNOSIS — J4540 Moderate persistent asthma, uncomplicated: Secondary | ICD-10-CM

## 2018-06-15 DIAGNOSIS — Z91018 Allergy to other foods: Secondary | ICD-10-CM | POA: Diagnosis not present

## 2018-06-15 DIAGNOSIS — L508 Other urticaria: Secondary | ICD-10-CM | POA: Diagnosis not present

## 2018-06-15 DIAGNOSIS — J309 Allergic rhinitis, unspecified: Secondary | ICD-10-CM

## 2018-06-15 DIAGNOSIS — H1010 Acute atopic conjunctivitis, unspecified eye: Secondary | ICD-10-CM

## 2018-06-15 MED ORDER — MONTELUKAST SODIUM 10 MG PO TABS: 10.0000 mg | ORAL_TABLET | Freq: Every day | ORAL | 5 refills | Status: DC

## 2018-06-15 MED ORDER — BUDESONIDE-FORMOTEROL FUMARATE 80-4.5 MCG/ACT IN AERO: 2.0000 | INHALATION_SPRAY | Freq: Two times a day (BID) | RESPIRATORY_TRACT | 5 refills | Status: DC

## 2018-06-15 MED ORDER — ALBUTEROL SULFATE HFA 108 (90 BASE) MCG/ACT IN AERS: INHALATION_SPRAY | RESPIRATORY_TRACT | 1 refills | Status: DC

## 2018-06-15 MED ORDER — TRIAMCINOLONE ACETONIDE 0.1 % EX OINT: 1.0000 "application " | TOPICAL_OINTMENT | CUTANEOUS | 5 refills | Status: DC | PRN

## 2018-06-15 NOTE — Progress Notes (Signed)
RE: Misty Barnes MRN: [id number] DOB: June 16, 1970 Date of Telemedicine Visit: 06/15/2018  Referring provider: Harlan Stains, MD Primary care provider: Harlan Stains, MD  Chief Complaint: Misty Barnes (doing well); Asthma (doing okay); and Allergic Rhinitis  (doing okay)   Telemedicine Follow Up Visit via Telephone: I connected with Misty Barnes for a follow up on 06/15/18 by telephone and verified that I am speaking with the correct person using two identifiers.   I discussed the limitations, risks, security and privacy concerns of performing an evaluation and management service by telephone and the availability of in person appointments. I also discussed with the patient that there may be a patient responsible charge related to this service. The patient expressed understanding and agreed to proceed.  Patient is at home  Provider is at the office.  Visit start time: 2:15 Visit end time: 2:51 Insurance consent/check in by: Evalee Mutton Medical consent and medical assistant/nurse: Davy Pique  History of Present Illness: She is a 48 y.o. female, who is being followed for asthma, chronic urticaria, allergic rhinitis, and food allergy . Her previous allergy office visit was on 02/11/2018 with Misty Barnes, [place]. At today's visit, she reports her urticaria has been well controlled with small hives that appear 1-2 days before her Xolair injection is due. She reports that she is currently taking Xyzal once a day and using a daily moisturizing lotion. She reports her hives have been well controlled while continuing on Xolair injections. Asthma is reported as well controlled with no shortness of breath, occasional wheeze with vigorous exercise when out in the pollens, and occasional dry cough. She continues Symbicort 80- 2 puffs twice a day with a spacer and uses her albuterol inhaler once every 2-3 months. She is not currently taking montelukast. Allergic rhinitis is reported as well controlled with out  the use of nasal sprays at this time. She continues to avoid eggs, fish and oatmeal and carry an epinephrine device at all times. She continues to be followed by [facility] and reports she is not currently taking plaquenil. Her current medications are listed in the chart.  Assessment and Plan:  Patient Instructions  1. Moderate persistent asthma, uncomplicated - Daily controller medication(s): Symbicort 80/4.5 two puffs twice daily with spacer + montelukast 10 mg once a day - Rescue medications: ProAir 4 puffs every 4-6 hours as needed - Asthma control goals:  * Full participation in all desired activities (may need albuterol before activity) * Albuterol use two time or less a week on average (not counting use with activity) * Cough interfering with sleep two time or less a month * Oral steroids no more than once a year * No hospitalizations  2. Perennial allergic rhinitis  - Continue Flonase 1-2 sprays in each nostril once a day as needed for a stuffy nose - Astelin 2 sprays in each nostril twice a day as needed for a runny nose or sneezing - Continue Xyzal 5 mg once a day as needed for a runny nose  3. Chronic urticaria  - Continue Xolair injections 300 mg once every 28 days - Have access to an epinephrine device (EpiPen) - Continue Xyzal 1-2 times a day as needed for itch  4. Food allergy Continue to avoid fish, egg (baked egg is fine since you have been tolerating this but avoid Egg Beaters that is not in a baked product), and oatmeal. In case of an allergic reaction, give Benadryl 50 mg every 4 hours, and  if life-threatening symptoms occur, inject with EpiPen 0.3 mg.   5. Follow up in 6 months or sooner if needed Please inform us of any Emergency Department visits, hospitalizations, or changes in symptoms. Call us before going to the ED for breathing or allergy symptoms since we might be able to fit you in for a sick visit. Feel free to contact  us anytime with any questions, problems, or concerns.    Websites that have reliable patient information: 1. American Academy of Asthma, Allergy, and Immunology: www.aaaai.org 2. Food Allergy Research and Education (FARE): foodallergy.org 3. Mothers of Asthmatics: http://www.asthmacommunitynetwork.org 4. American College of Allergy, Asthma, and Immunology: www.acaai.org     Return in about 6 months (around 12/15/2018), or if symptoms worsen or fail to improve.  Meds ordered this encounter  Medications  . budesonide-formoterol (SYMBICORT) 80-4.5 MCG/ACT inhaler    Sig: Inhale 2 puffs into the lungs 2 (two) times daily.    Dispense:  1 Inhaler    Refill:  5  . albuterol (PROAIR HFA) 108 (90 Base) MCG/ACT inhaler    Sig: Inhale 4 puffs every 4-6 hours as needed for cough, wheeze or shortness of breath    Dispense:  1 Inhaler    Refill:  1  . triamcinolone ointment (KENALOG) 0.1 %    Sig: Apply 1 application topically as needed.    Dispense:  454 g    Refill:  5  . montelukast (SINGULAIR) 10 MG tablet    Sig: Take 1 tablet (10 mg total) by mouth at bedtime.    Dispense:  30 tablet    Refill:  5   Lab Orders  No laboratory test(s) ordered today    Diagnostics: None.  Medication List:  Current Outpatient Medications  Medication Sig Dispense Refill  . acetaminophen (TYLENOL) 500 MG tablet Take 500 mg by mouth every 6 (six) hours as needed.      Marland Kitchen albuterol (PROAIR HFA) 108 (90 Base) MCG/ACT inhaler Inhale 4 puffs every 4-6 hours as needed for cough, wheeze or shortness of breath 1 Inhaler 1  . Azelastine HCl 0.15 % SOLN Place 2 sprays into both nostrils 2 (two) times daily. 30 mL 5  . Budesonide POWD 1 application by Does not apply route 3 (three) times daily. Budesonide Cream- apply To the face    . budesonide-formoterol (SYMBICORT) 80-4.5 MCG/ACT inhaler Inhale 2 puffs into the lungs 2 (two) times daily. 1 Inhaler 5  . desonide (DESOWEN) 0.05 % ointment Apply 7.41  application topically 3 (three) times daily.  3  . doxycycline (VIBRAMYCIN) 50 MG capsule TK 1 C PO Q 12 H  5  . EPINEPHrine 0.3 mg/0.3 mL IJ SOAJ injection Use as directed for severe allergic reaction 2 Device 2  . EUCRISA 2 % OINT APPLY AA BID PRN  3  . fluticasone (CUTIVATE) 0.005 % ointment Apply 2.878 application topically 3 (three) times daily.  3  . Halcinonide (HALOG EX) Apply 1 application topically 3 (three) times daily. Over body except face and groin    . hydrOXYzine (VISTARIL) 25 MG capsule Take 25-50 mg by mouth 2 (two) times daily.     Marland Kitchen ipratropium-albuterol (DUONEB) 0.5-2.5 (3) MG/3ML SOLN Take 3 mLs by nebulization.    Marland Kitchen levocetirizine (XYZAL) 5 MG tablet Take 1-2 tablets by mouth in the evening as needed 60 tablet 5  . triamcinolone ointment (KENALOG) 0.1 % Apply 1 application topically as needed. 454 g 5  . TURMERIC PO Take by mouth daily.    Marland Kitchen  ZINC OXIDE PO Take by mouth daily.    . montelukast (SINGULAIR) 10 MG tablet Take 1 tablet (10 mg total) by mouth at bedtime. (Patient not taking: Reported on 06/15/2018) 30 tablet 5  . montelukast (SINGULAIR) 10 MG tablet Take 1 tablet (10 mg total) by mouth at bedtime. 30 tablet 5   Current Facility-Administered Medications  Medication Dose Route Frequency Provider Last Rate Last Dose  . omalizumab Misty Barnes) injection 300 mg  300 mg Subcutaneous Q28 days Valentina Shaggy, MD   300 mg at 05/14/18 4034   Allergies: Allergies  Allergen Reactions  . Fish Allergy Anaphylaxis  . Penicillins Anaphylaxis  . Percocet [Oxycodone-Acetaminophen] Nausea And Vomiting  . Plaquenil [Hydroxychloroquine Sulfate]     rash  . Sulfa Antibiotics     Unknown reaction   I reviewed her past medical history, social history, family history, and environmental history and no significant changes have been reported from previous visit on 01/12/2018.  Objective: Physical Exam Not obtained as encounter was done via telephone.   Previous notes and  tests were reviewed.  I discussed the assessment and treatment plan with the patient. The patient was provided an opportunity to ask questions and all were answered. The patient agreed with the plan and demonstrated an understanding of the instructions.   The patient was advised to call back or seek an in-person evaluation if the symptoms worsen or if the condition fails to improve as anticipated.  I provided 41 minutes of non-face-to-face time during this encounter.  It was my pleasure to participate in [place] care today. Please feel free to contact me with any questions or concerns.   Sincerely,  Misty Morgan, FNP

## 2018-06-15 NOTE — Patient Instructions (Addendum)
1. Moderate persistent asthma, uncomplicated - Daily controller medication(s): Symbicort 80/4.5 two puffs twice daily with spacer + montelukast 10 mg once a day - Rescue medications: ProAir 4 puffs every 4-6 hours as needed - Asthma control goals:  * Full participation in all desired activities (may need albuterol before activity) * Albuterol use two time or less a week on average (not counting use with activity) * Cough interfering with sleep two time or less a month * Oral steroids no more than once a year * No hospitalizations  2. Perennial allergic rhinitis  - Continue Flonase 1-2 sprays in each nostril once a day as needed for a stuffy nose - Astelin 2 sprays in each nostril twice a day as needed for a runny nose or sneezing - Continue Xyzal 5 mg once a day as needed for a runny nose  3. Chronic urticaria  - Continue Xolair injections 300 mg once every 28 days - Have access to an epinephrine device (EpiPen) - Continue Xyzal 1-2 times a day as needed for itch  4. Food allergy Continue to avoid fish, egg (baked egg is fine since you have been tolerating this but avoid Egg Beaters that is not in a baked product), and oatmeal. In case of an allergic reaction, give Benadryl 50 mg every 4 hours, and if life-threatening symptoms occur, inject with EpiPen 0.3 mg.   5. Follow up in 6 months or sooner if needed Please inform us of any Emergency Department visits, hospitalizations, or changes in symptoms. Call us before going to the ED for breathing or allergy symptoms since we might be able to fit you in for a sick visit. Feel free to contact us anytime with any questions, problems, or concerns.    Websites that have reliable patient information: 1. American Academy of Asthma, Allergy, and Immunology: www.aaaai.org 2. Food Allergy Research and Education (FARE): foodallergy.org 3. Mothers of Asthmatics: http://www.asthmacommunitynetwork.org 4. American College of Allergy, Asthma, and  Immunology: www.acaai.org

## 2018-06-15 NOTE — Progress Notes (Signed)
Start time:  [place] Time:  1451 Where are you located:  home Do you give [place] permission to bill your insurance:  yes Are you signed up for my chart:  No, sent link.  Needs to get re-certified for Xolair injections. Feels like her injections are working.  Asthma and allergies are doing okay.

## 2018-06-29 ENCOUNTER — Ambulatory Visit (INDEPENDENT_AMBULATORY_CARE_PROVIDER_SITE_OTHER): Payer: PRIVATE HEALTH INSURANCE | Admitting: *Deleted

## 2018-06-29 ENCOUNTER — Other Ambulatory Visit: Payer: Self-pay

## 2018-06-29 DIAGNOSIS — L501 Idiopathic urticaria: Secondary | ICD-10-CM | POA: Diagnosis not present

## 2018-07-10 ENCOUNTER — Ambulatory Visit: Payer: PRIVATE HEALTH INSURANCE | Admitting: Rheumatology

## 2018-07-13 ENCOUNTER — Telehealth: Payer: Self-pay | Admitting: *Deleted

## 2018-07-13 NOTE — Telephone Encounter (Signed)
Received call from [facility] regarding biologic therapy.  They had received Rx transferred from [facility] to [facility] for [place] from dermatologist and patient already on [place].  I checked her chart and seen where she had seen dermatology but no mention of Dupixent.  I reached backout to pharmacy and they advised that she had never filled same yet.  I advised them to put Rx on hold and L/m for patient to call me to find out which therapy she wants to do.  She did see Gareth Morgan, FNP on 06/15/18 and was doing well on Xolair so I will await her callback to find out which therapy she wants to use at this time.

## 2018-07-18 NOTE — Telephone Encounter (Signed)
Received call from [facility] and it seems patient did reach back out to them tot advise she is not going to move forward with starting [place] and is going to stay on Xolair and next shipment is set for 6/2

## 2018-07-27 ENCOUNTER — Ambulatory Visit: Payer: Self-pay

## 2018-07-30 ENCOUNTER — Other Ambulatory Visit: Payer: Self-pay

## 2018-07-30 ENCOUNTER — Ambulatory Visit (INDEPENDENT_AMBULATORY_CARE_PROVIDER_SITE_OTHER): Payer: PRIVATE HEALTH INSURANCE

## 2018-07-30 DIAGNOSIS — L501 Idiopathic urticaria: Secondary | ICD-10-CM

## 2018-08-27 ENCOUNTER — Ambulatory Visit (INDEPENDENT_AMBULATORY_CARE_PROVIDER_SITE_OTHER): Payer: PRIVATE HEALTH INSURANCE

## 2018-08-27 ENCOUNTER — Other Ambulatory Visit: Payer: Self-pay

## 2018-08-27 DIAGNOSIS — L501 Idiopathic urticaria: Secondary | ICD-10-CM | POA: Diagnosis not present

## 2018-08-27 MED ORDER — OMALIZUMAB 150 MG ~~LOC~~ SOLR
300.0000 mg | SUBCUTANEOUS | Status: AC
Start: 2018-08-27 — End: ?
  Administered 2018-08-27 – 2018-11-19 (×4): 300 mg via SUBCUTANEOUS

## 2018-09-24 ENCOUNTER — Ambulatory Visit (INDEPENDENT_AMBULATORY_CARE_PROVIDER_SITE_OTHER): Payer: PRIVATE HEALTH INSURANCE | Admitting: *Deleted

## 2018-09-24 ENCOUNTER — Other Ambulatory Visit: Payer: Self-pay

## 2018-09-24 DIAGNOSIS — L501 Idiopathic urticaria: Secondary | ICD-10-CM

## 2018-10-11 NOTE — Progress Notes (Deleted)
Office Visit Note  Patient: Misty Barnes             Date of Birth: 11/24/1970           MRN: [id number]             PCP: Harlan Stains, MD Referring: Harlan Stains, MD Visit Date: 10/25/2018 Occupation: @GUAROCC @  Subjective:  No chief complaint on file.   History of Present Illness: Misty Barnes is a 48 y.o. female ***   Activities of Daily Living:  Patient reports morning stiffness for *** {minute/hour:19697}.   Patient {ACTIONS;DENIES/REPORTS:21021675::"Denies"} nocturnal pain.  Difficulty dressing/grooming: {ACTIONS;DENIES/REPORTS:21021675::"Denies"} Difficulty climbing stairs: {ACTIONS;DENIES/REPORTS:21021675::"Denies"} Difficulty getting out of chair: {ACTIONS;DENIES/REPORTS:21021675::"Denies"} Difficulty using hands for taps, buttons, cutlery, and/or writing: {ACTIONS;DENIES/REPORTS:21021675::"Denies"}  No Rheumatology ROS completed.   PMFS History:  Patient Active Problem List   Diagnosis Date Noted  . Chronic urticaria 06/15/2018  . Sjogren's syndrome with keratoconjunctivitis sicca ([place]) 03/28/2018  . High risk medication use 03/28/2018  . Idiopathic urticaria 01/12/2018  . Flexural atopic dermatitis 12/31/2015  . Food allergy 12/31/2015  . Allergic rhinoconjunctivitis 12/31/2015  . Moderate persistent asthma, uncomplicated 60/73/7106  . Obesity (BMI 35.0-39.9 without comorbidity) 12/15/2011  . Breast mass, right 10/26/2011  . Hidradenitis suppurativa of right axilla 10/26/2011  . Myofascial muscle pain 05/10/2011  . Cervicalgia 05/10/2011  . Intractable migraine with aura 05/10/2011  . Vitamin D deficiency 05/10/2011    Past Medical History:  Diagnosis Date  . Asthma   . Chronic kidney disease    stones  . Eczema   . Headache(784.0)    migraines  . Hypertension   . PONV (postoperative nausea and vomiting)     Family History  Problem Relation Age of Onset  . Hypertension Father   . Multiple sclerosis Mother   . Allergic rhinitis  Paternal Grandmother   . Asthma Paternal Grandmother   . Allergic rhinitis Paternal Aunt   . Asthma Paternal Aunt   . Allergies Brother   . Healthy Brother   . Healthy Son   . Healthy Daughter    Past Surgical History:  Procedure Laterality Date  . ABDOMINAL HYSTERECTOMY    . CESAREAN SECTION     x 2  . CYSTOSCOPY  01/10/2011   Procedure: CYSTOSCOPY;  Surgeon: Lubertha South Romine;  Location: [facility];  Service: Gynecology;  Laterality: N/A;  . ENDOMETRIAL ABLATION  2005  . HERNIA REPAIR    . LIPOMA EXCISION     cs   Social History   Social History Narrative  . Not on file    There is no immunization history on file for this patient.   Objective: Vital Signs: LMP 11/07/2010    Physical Exam   Musculoskeletal Exam: ***  CDAI Exam: CDAI Score: - Patient Global: -; Provider Global: - Swollen: -; Tender: - Joint Exam   No joint exam has been documented for this visit   There is currently no information documented on the homunculus. Go to the Rheumatology activity and complete the homunculus joint exam.  Investigation: No additional findings.  Imaging: No results found.  Recent Labs: Lab Results  Component Value Date   WBC 7.7 01/12/2018   HGB 11.3 01/12/2018   PLT 256 01/12/2018   NA 135 03/14/2018   K 3.9 03/14/2018   CL 102 03/14/2018   CO2 21 03/14/2018   GLUCOSE 75 03/14/2018   BUN 12 03/14/2018   CREATININE 0.87 03/14/2018   BILITOT 0.3 03/14/2018   ALKPHOS 69 09/07/2016  AST 21 03/14/2018   ALT 14 03/14/2018   PROT 8.3 (H) 03/14/2018   PROT 8.0 03/14/2018   ALBUMIN 3.6 09/07/2016   CALCIUM 9.1 03/14/2018   GFRAA 93 03/14/2018   QFTBGOLDPLUS NEGATIVE 03/14/2018    Speciality Comments: No specialty comments available.  Procedures:  No procedures performed Allergies: Fish allergy, Penicillins, Percocet [oxycodone-acetaminophen], Plaquenil [hydroxychloroquine sulfate], and Sulfa antibiotics   Assessment / Plan:     Visit Diagnoses: No  diagnosis found.  Orders: No orders of the defined types were placed in this encounter.  No orders of the defined types were placed in this encounter.   Face-to-face time spent with patient was *** minutes. Greater than 50% of time was spent in counseling and coordination of care.  Follow-Up Instructions: No follow-ups on file.   Misty Neas, PA-C  Note - This record has been created using Dragon software.  Chart creation errors have been sought, but may not always  have been located. Such creation errors do not reflect on  the standard of medical care.

## 2018-10-22 ENCOUNTER — Other Ambulatory Visit: Payer: Self-pay

## 2018-10-22 ENCOUNTER — Ambulatory Visit (INDEPENDENT_AMBULATORY_CARE_PROVIDER_SITE_OTHER): Payer: PRIVATE HEALTH INSURANCE

## 2018-10-22 ENCOUNTER — Telehealth: Payer: Self-pay | Admitting: Rheumatology

## 2018-10-22 DIAGNOSIS — L501 Idiopathic urticaria: Secondary | ICD-10-CM

## 2018-10-22 NOTE — Telephone Encounter (Signed)
Patient called stating Dr. Estanislado Pandy referred her to Dr. Saundra Shelling, dermatologist and Dr. Adele Schilder, Rheumatologist.  Patient states she has not been able to schedule appointments due to [place].  Patient is requesting a return call to discuss if she needs to wait to reschedule with Dr. Estanislado Pandy until she is seen by both doctors.

## 2018-10-23 NOTE — Telephone Encounter (Signed)
Ivin Booty spoke with dermatology and rheumatology departments at [facility].  Patient has canceled appointments.  They are waiting for patient to call back and schedule the appointment.  Ivin Booty has left message for the patient to contact the respective departments.  She is to follow-up with those departments at this point.  No need to follow-up with our office.

## 2018-10-25 ENCOUNTER — Ambulatory Visit: Payer: Self-pay | Admitting: Rheumatology

## 2018-11-19 ENCOUNTER — Other Ambulatory Visit: Payer: Self-pay

## 2018-11-19 ENCOUNTER — Ambulatory Visit (INDEPENDENT_AMBULATORY_CARE_PROVIDER_SITE_OTHER): Payer: PRIVATE HEALTH INSURANCE

## 2018-11-19 DIAGNOSIS — L501 Idiopathic urticaria: Secondary | ICD-10-CM

## 2018-12-06 ENCOUNTER — Ambulatory Visit: Payer: PRIVATE HEALTH INSURANCE | Admitting: Allergy & Immunology

## 2018-12-13 ENCOUNTER — Telehealth: Payer: Self-pay | Admitting: *Deleted

## 2018-12-13 NOTE — Telephone Encounter (Signed)
Patient called and stated that her Dermatologist wants her to stop taking Xolair and start taking Dupixent. Her Dermatologist is setting up for her to receive Dupixent at their office. At this time her Xolair appt has been cancelled and her medication has been moved to inactive for now.

## 2018-12-13 NOTE — Telephone Encounter (Signed)
I will move patient to inactive at this time

## 2018-12-14 NOTE — Telephone Encounter (Signed)
Sounds good to me.  Salvatore Marvel, MD [facility] [place] [facility]

## 2018-12-17 ENCOUNTER — Ambulatory Visit: Payer: Self-pay

## 2019-01-01 ENCOUNTER — Ambulatory Visit: Payer: PRIVATE HEALTH INSURANCE | Admitting: Allergy & Immunology

## 2019-02-25 ENCOUNTER — Ambulatory Visit (HOSPITAL_COMMUNITY)
Admission: EM | Admit: 2019-02-25 | Discharge: 2019-02-25 | Disposition: A | Discharge status: Home / Self Care | Payer: PRIVATE HEALTH INSURANCE | Attending: Family Medicine | Admitting: Family Medicine

## 2019-02-25 ENCOUNTER — Other Ambulatory Visit: Payer: Self-pay

## 2019-02-25 ENCOUNTER — Encounter (HOSPITAL_COMMUNITY): Payer: Self-pay

## 2019-02-25 DIAGNOSIS — R519 Headache, unspecified: Secondary | ICD-10-CM | POA: Diagnosis not present

## 2019-02-25 DIAGNOSIS — Z20822 Contact with and (suspected) exposure to covid-19: Secondary | ICD-10-CM

## 2019-02-25 DIAGNOSIS — B349 Viral infection, unspecified: Secondary | ICD-10-CM | POA: Diagnosis not present

## 2019-02-25 DIAGNOSIS — R0982 Postnasal drip: Secondary | ICD-10-CM | POA: Diagnosis not present

## 2019-02-25 DIAGNOSIS — M791 Myalgia, unspecified site: Secondary | ICD-10-CM

## 2019-02-25 DIAGNOSIS — Z9189 Other specified personal risk factors, not elsewhere classified: Secondary | ICD-10-CM

## 2019-02-25 LAB — POC SARS CORONAVIRUS 2 AG -  ED: SARS Coronavirus 2 Ag: NEGATIVE

## 2019-02-25 LAB — POC SARS CORONAVIRUS 2 AG: SARS Coronavirus 2 Ag: NEGATIVE

## 2019-02-25 MED ORDER — ALBUTEROL SULFATE HFA 108 (90 BASE) MCG/ACT IN AERS: INHALATION_SPRAY | RESPIRATORY_TRACT | 0 refills | Status: DC

## 2019-02-25 NOTE — Discharge Instructions (Signed)
The rapid coronavirus test is negative The confirmation test has been sent. This test should be available in MyChart You must quarantine until your test result is available

## 2019-02-25 NOTE — ED Provider Notes (Addendum)
[place] [facility]    CSN: [id number] Arrival date & time: 02/25/19  1608      History   Chief Complaint Chief Complaint  Patient presents with  . Headache  . Generalized Body Aches  . Congestion  . Nasal Drainage    HPI Misty Barnes is a 49 y.o. female.   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  Patient states since yesterday she was having body ache, headache, congestion, and postnasal drip.  No fever or chills.  No change in taste or smell.  No nausea or vomiting.  No shortness of breath. Patient does have underlying asthma. Patient has no wheezing, shortness of breath, or cough currently  Past Medical History:  Diagnosis Date  . Asthma   . Chronic kidney disease    stones  . Eczema   . Headache(784.0)    migraines  . Hypertension   . PONV (postoperative nausea and vomiting)     Patient Active Problem List   Diagnosis Date Noted  . Chronic urticaria 06/15/2018  . Sjogren's syndrome with keratoconjunctivitis sicca ([place]) 03/28/2018  . High risk medication use 03/28/2018  . Idiopathic urticaria 01/12/2018  . Flexural atopic dermatitis 12/31/2015  . Food allergy 12/31/2015  . Allergic rhinoconjunctivitis 12/31/2015  . Moderate persistent asthma, uncomplicated 123456  . Obesity (BMI 35.0-39.9 without comorbidity) 12/15/2011  . Breast mass, right 10/26/2011  . Hidradenitis suppurativa of right axilla 10/26/2011  . Myofascial muscle pain 05/10/2011  . Cervicalgia 05/10/2011  . Intractable migraine with aura 05/10/2011  . Vitamin D deficiency 05/10/2011    Past Surgical History:  Procedure Laterality Date  . ABDOMINAL HYSTERECTOMY    . CESAREAN SECTION     x 2  . CYSTOSCOPY  01/10/2011   Procedure: CYSTOSCOPY;  Surgeon: Lubertha South Romine;  Location: [facility];  Service: Gynecology;  Laterality: N/A;  . ENDOMETRIAL ABLATION  2005  . HERNIA REPAIR    . LIPOMA EXCISION     cs    OB History   No  obstetric history on file.      Home Medications    Prior to Admission medications   Medication Sig Start Date End Date Taking? Authorizing Provider  acetaminophen (TYLENOL) 500 MG tablet Take 500 mg by mouth every 6 (six) hours as needed.      [provider]  albuterol (PROAIR HFA) 108 (90 Base) MCG/ACT inhaler Inhale 4 puffs every 4-6 hours as needed for cough, wheeze or shortness of breath 06/15/18   Ambs, Kathrine Cords, FNP  Azelastine HCl 0.15 % SOLN Place 2 sprays into both nostrils 2 (two) times daily. 01/12/18   [name], FNP  Budesonide POWD 1 application by Does not apply route 3 (three) times daily. Budesonide Cream- apply To the face    [provider]  budesonide-formoterol (SYMBICORT) 80-4.5 MCG/ACT inhaler Inhale 2 puffs into the lungs 2 (two) times daily. 06/15/18   [name], FNP  desonide (DESOWEN) 0.05 % ointment Apply [id number] application topically 3 (three) times daily. 02/03/16   [provider]  EPINEPHrine 0.3 mg/0.3 mL IJ SOAJ injection Use as directed for severe allergic reaction 02/19/18   Valentina Shaggy, MD  EUCRISA 2 % OINT APPLY AA BID PRN 11/20/17   [provider]  fluticasone (CUTIVATE) 0.005 % ointment Apply [id number] application topically 3 (three) times daily. 02/19/16   [provider]  Halcinonide (HALOG EX) Apply 1  application topically 3 (three) times daily. Over body except face and groin    [provider]  hydrOXYzine (VISTARIL) 25 MG capsule Take 25-50 mg by mouth 2 (two) times daily.     [provider]  ipratropium-albuterol (DUONEB) 0.5-2.5 (3) MG/3ML SOLN Take 3 mLs by nebulization.    [provider]  levocetirizine (XYZAL) 5 MG tablet Take 1-2 tablets by mouth in the evening as needed 01/12/18   Ambs, Kathrine Cords, FNP  montelukast (SINGULAIR) 10 MG tablet Take 1 tablet (10 mg total) by mouth at bedtime. Patient not taking: Reported on 06/15/2018 12/31/15   [name],  MD  montelukast (SINGULAIR) 10 MG tablet Take 1 tablet (10 mg total) by mouth at bedtime. 06/15/18   [name], FNP  triamcinolone ointment (KENALOG) 0.1 % Apply 1 application topically as needed. 06/15/18   [name], FNP  TURMERIC PO Take by mouth daily.    [provider]  ZINC OXIDE PO Take by mouth daily.    [provider]  DULoxetine (CYMBALTA) 60 MG capsule Take 60 mg by mouth daily.    05/10/11  [provider]    Family History Family History  Problem Relation Age of Onset  . Hypertension Father   . Multiple sclerosis Mother   . Allergic rhinitis Paternal Grandmother   . Asthma Paternal Grandmother   . Allergic rhinitis Paternal Aunt   . Asthma Paternal Aunt   . Allergies Brother   . Healthy Brother   . Healthy Son   . Healthy Daughter     Social History Social History   Tobacco Use  . Smoking status: Never Smoker  . Smokeless tobacco: Never Used  Substance Use Topics  . Alcohol use: No    Comment: occ  . Drug use: No     Allergies   Fish allergy, Penicillins, Percocet [oxycodone-acetaminophen], Plaquenil [hydroxychloroquine sulfate], and Sulfa antibiotics   Review of Systems Review of Systems  Constitutional: Positive for fatigue. Negative for chills and fever.  HENT: Positive for congestion. Negative for hearing loss.   Eyes: Negative for pain.  Respiratory: Negative for cough and shortness of breath.   Cardiovascular: Negative for chest pain and leg swelling.  Gastrointestinal: Negative for abdominal pain, constipation and diarrhea.  Genitourinary: Negative for dysuria and frequency.  Musculoskeletal: Positive for myalgias.  Neurological: Positive for headaches. Negative for dizziness and seizures.  Psychiatric/Behavioral: The patient is not nervous/anxious.      Physical Exam Triage Vital Signs ED Triage Vitals [02/25/19 1802]  Enc Vitals Group     BP (!) 121/59     Pulse Rate 80     Resp 16     Temp 97.9 F  (36.6 C)     Temp Source Oral     SpO2 100 %     Weight      Height      Head Circumference      Peak Flow      Pain Score 7     Pain Loc      Pain Edu?      Excl. in [place]?    No data found.  Updated Vital Signs BP (!) 121/59 (BP Location: Left Arm)   Pulse 80   Temp 97.9 F (36.6 C) (Oral)   Resp 16   LMP 11/07/2010   SpO2 100%     Physical Exam Constitutional:      General: She is not in acute distress.  Appearance: She is well-developed. She is obese.  HENT:     Head: Normocephalic and atraumatic.     Mouth/Throat:     Comments: Mask in place Eyes:     Conjunctiva/sclera: Conjunctivae normal.     Pupils: Pupils are equal, round, and reactive to light.  Cardiovascular:     Rate and Rhythm: Normal rate and regular rhythm.     Heart sounds: Normal heart sounds.  Pulmonary:     Effort: Pulmonary effort is normal. No respiratory distress.     Breath sounds: Normal breath sounds. No wheezing or rales.  Abdominal:     General: There is no distension.     Palpations: Abdomen is soft.  Musculoskeletal:        General: Normal range of motion.     Cervical back: Normal range of motion.  Skin:    General: Skin is warm and dry.  Neurological:     Mental Status: She is alert.  Psychiatric:        Mood and Affect: Mood normal.        Behavior: Behavior normal.      UC Treatments / Results  Labs (all labs ordered are listed, but only abnormal results are displayed) Labs Reviewed  POC SARS CORONAVIRUS 2 AG -  ED  POC SARS CORONAVIRUS 2 AG   Rapid coronavirus test is negative EKG   Radiology No results found.  Procedures Procedures (including critical care time)  Medications Ordered in UC Medications - No data to display  Initial Impression / Assessment and Plan / UC Course  I have reviewed the triage vital signs and the nursing notes.  Pertinent labs & imaging results that were available during my care of the patient were reviewed by me and  considered in my medical decision making (see chart for details).     Discussed coronavirus.  How to get test results.  The important of quarantine Final Clinical Impressions(s) / UC Diagnoses   Final diagnoses:  Viral illness  At increased risk of exposure to COVID-19 virus     Discharge Instructions     The rapid coronavirus test is negative The confirmation test has been sent. This test should be available in MyChart You must quarantine until your test result is available   ED Prescriptions    None     PDMP not reviewed this encounter.   Raylene Everts, MD 02/25/19 1833    Raylene Everts, MD 02/25/19 [phone]

## 2019-02-25 NOTE — ED Triage Notes (Signed)
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

## 2019-05-08 ENCOUNTER — Ambulatory Visit: Payer: PRIVATE HEALTH INSURANCE | Admitting: Podiatry

## 2019-05-20 ENCOUNTER — Other Ambulatory Visit: Payer: Self-pay

## 2019-05-20 ENCOUNTER — Ambulatory Visit: Payer: PRIVATE HEALTH INSURANCE | Admitting: Podiatry

## 2019-05-20 ENCOUNTER — Ambulatory Visit (INDEPENDENT_AMBULATORY_CARE_PROVIDER_SITE_OTHER): Payer: PRIVATE HEALTH INSURANCE

## 2019-05-20 VITALS — Temp 98.3°F

## 2019-05-20 DIAGNOSIS — M2042 Other hammer toe(s) (acquired), left foot: Secondary | ICD-10-CM

## 2019-05-20 DIAGNOSIS — M79672 Pain in left foot: Secondary | ICD-10-CM

## 2019-05-20 DIAGNOSIS — L989 Disorder of the skin and subcutaneous tissue, unspecified: Secondary | ICD-10-CM | POA: Diagnosis not present

## 2019-05-20 NOTE — Patient Instructions (Signed)
Pre-Operative Instructions  Congratulations, you have decided to take an important step towards improving your quality of life.  You can be assured that the doctors and staff at [facility] will be with you every step of the way.  Here are some important things you should know:  1. Plan to be at the surgery center/hospital at least 1 (one) hour prior to your scheduled time, unless otherwise directed by the surgical center/hospital staff.  You must have a responsible adult accompany you, remain during the surgery and drive you home.  Make sure you have directions to the surgical center/hospital to ensure you arrive on time. 2. If you are having surgery at [facility] or [facility] hospitals, you will need a copy of your medical history and physical form from your family physician within one month prior to the date of surgery. We will give you a form for your primary physician to complete.  3. We make every effort to accommodate the date you request for surgery.  However, there are times where surgery dates or times have to be moved.  We will contact you as soon as possible if a change in schedule is required.   4. No aspirin/ibuprofen for one week before surgery.  If you are on aspirin, any non-steroidal anti-inflammatory medications (Mobic, Aleve, Ibuprofen) should not be taken seven (7) days prior to your surgery.  You make take Tylenol for pain prior to surgery.  5. Medications - If you are taking daily heart and blood pressure medications, seizure, reflux, allergy, asthma, anxiety, pain or diabetes medications, make sure you notify the surgery center/hospital before the day of surgery so they can tell you which medications you should take or avoid the day of surgery. 6. No food or drink after midnight the night before surgery unless directed otherwise by surgical center/hospital staff. 7. No alcoholic beverages 24-hours prior to surgery.  No smoking 24-hours prior or 24-hours after  surgery. 8. Wear loose pants or shorts. They should be loose enough to fit over bandages, boots, and casts. 9. Don't wear slip-on shoes. Sneakers are preferred. 10. Bring your boot with you to the surgery center/hospital.  Also bring crutches or a walker if your physician has prescribed it for you.  If you do not have this equipment, it will be provided for you after surgery. 11. If you have not been contacted by the surgery center/hospital by the day before your surgery, call to confirm the date and time of your surgery. 12. Leave-time from work may vary depending on the type of surgery you have.  Appropriate arrangements should be made prior to surgery with your employer. 13. Prescriptions will be provided immediately following surgery by your doctor.  Fill these as soon as possible after surgery and take the medication as directed. Pain medications will not be refilled on weekends and must be approved by the doctor. 14. Remove nail polish on the operative foot and avoid getting pedicures prior to surgery. 15. Wash the night before surgery.  The night before surgery wash the foot and leg well with water and the antibacterial soap provided. Be sure to pay special attention to beneath the toenails and in between the toes.  Wash for at least three (3) minutes. Rinse thoroughly with water and dry well with a towel.  Perform this wash unless told not to do so by your physician.  Enclosed: 1 Ice pack (please put in freezer the night before surgery)   1 Hibiclens skin cleaner     Pre-op instructions  If you have any questions regarding the instructions, please do not hesitate to call our office.  [place]: [address], [place], [place] [address] -- [phone]  [place]: [address] [place], [place] [address] -- [phone]  [place]: [address], [place], [place] [address] -- [phone]   Website: [email or web address] 

## 2019-05-21 ENCOUNTER — Other Ambulatory Visit: Payer: Self-pay | Admitting: Podiatry

## 2019-05-21 DIAGNOSIS — M2042 Other hammer toe(s) (acquired), left foot: Secondary | ICD-10-CM

## 2019-05-22 NOTE — Progress Notes (Signed)
   HPI: 49 y.o. female presenting today as a new patient with a chief complaint of pain to the left 5th digit that has been ongoing for the past 10 years. She was told by Dr. Gershon Mussel she had a bone spur. She states she had hammertoe repair surgery with Dr. Paulla Dolly in the past and has experienced gradually worsening pain since. She has been resting and elevating the foot for treatment. Walking and being on the foot increases the pain. Patient is here for further evaluation and treatment.   Past Medical History:  Diagnosis Date  . Asthma   . Chronic kidney disease    stones  . Eczema   . Headache(784.0)    migraines  . Hypertension   . PONV (postoperative nausea and vomiting)       Objective: Physical Exam General: The patient is alert and oriented x3 in no acute distress.  Dermatology: Hyperkeratotic lesion(s) present on the left sub-fifth MPJ. Pain on palpation with a central nucleated core noted. Skin is cool, dry and supple bilateral lower extremities. Negative for open lesions or macerations.  Vascular: Palpable pedal pulses bilaterally. No edema or erythema noted. Capillary refill within normal limits.  Neurological: Epicritic and protective threshold grossly intact bilaterally.   Musculoskeletal Exam: All pedal and ankle joints range of motion within normal limits bilateral. Muscle strength 5/5 in all groups bilateral. Hammertoe contracture deformity noted to the 5th digit of the left foot.  Radiographic Exam:  Normal osseous mineralization. Joint spaces preserved. No fracture/dislocation/boney destruction. Evidence of bunionectomy with k-wire fixation and Tailor's bunionectomy without hardware.      Assessment: 1. 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 Porokeratosis left sub-fifth MPJ   Plan of Care:  1. Patient evaluated. X-Rays reviewed.  2. Today we discussed the conservative versus surgical management of the presenting pathology. The patient opts for surgical management.  All possible complications and details of the procedure were explained. All patient questions were answered. No guarantees were expressed or implied. 3. Authorization for surgery was initiated today. Surgery will consist of PIPJ arthroplasty with derotational skin plast left.  4. Post op shoe dispensed.  5. Excisional debridement of keratotic lesion(s) using a chisel blade was performed without incident. Light dressing applied.  6. Recommended OTC corn and callus remover.  7. Return to clinic one week post op.     Edrick Kins, DPM [facility]  Dr. Edrick Kins, DPM    [address], [place] [address]                Office [phone]  Fax [phone]

## 2019-06-04 ENCOUNTER — Other Ambulatory Visit: Payer: Self-pay

## 2019-06-04 NOTE — Telephone Encounter (Signed)
Denied refill fexofenadine 180 mg.  Patient last ov 06/15/2018.  Per chart note: Follow up in 6 months or sooner if needed  07/27/2018 Canceled appointment with Dr. Ernst Bowler 12/06/2018 Canceled appointment 12/17/2018 Canceled appointment 01/01/2019 No Show appointment  Patient needs OV. Left message for patient to call clinic to schedule an OV with Dr. Ernst Bowler.

## 2019-06-17 ENCOUNTER — Other Ambulatory Visit: Payer: Self-pay

## 2019-06-17 NOTE — Telephone Encounter (Signed)
Duplicate refill for fexofenadine denial

## 2019-06-25 ENCOUNTER — Telehealth: Payer: Self-pay

## 2019-06-25 NOTE — Telephone Encounter (Signed)
Misty Barnes called to cancel her surgery that was scheduled for 07/25/19. She stated she can't afford to take time off of work at this time. I notified Renee at [facility] that she has cancelled.

## 2019-07-01 ENCOUNTER — Other Ambulatory Visit: Payer: Self-pay | Admitting: Family Medicine

## 2019-07-01 DIAGNOSIS — H1010 Acute atopic conjunctivitis, unspecified eye: Secondary | ICD-10-CM

## 2019-07-01 DIAGNOSIS — J4540 Moderate persistent asthma, uncomplicated: Secondary | ICD-10-CM

## 2019-07-01 DIAGNOSIS — J309 Allergic rhinitis, unspecified: Secondary | ICD-10-CM

## 2019-07-15 ENCOUNTER — Encounter: Payer: Self-pay | Admitting: Podiatry

## 2019-07-15 ENCOUNTER — Ambulatory Visit: Payer: PRIVATE HEALTH INSURANCE | Admitting: Podiatry

## 2019-07-15 ENCOUNTER — Other Ambulatory Visit: Payer: Self-pay

## 2019-07-15 DIAGNOSIS — M79672 Pain in left foot: Secondary | ICD-10-CM

## 2019-07-15 DIAGNOSIS — M2042 Other hammer toe(s) (acquired), left foot: Secondary | ICD-10-CM | POA: Diagnosis not present

## 2019-07-15 DIAGNOSIS — L989 Disorder of the skin and subcutaneous tissue, unspecified: Secondary | ICD-10-CM

## 2019-07-18 NOTE — Progress Notes (Signed)
   HPI: 49 y.o. female presenting today for follow up evaluation of porokeratosis of the left sub-fifth MPJ and a hammertoe contracture of the left fifth toe. She reports sharp pain that returned a few weeks ago. Walking increases the pain. She has been treating the hammertoe conservatively as directed. Patient is here for further evaluation and treatment.   Past Medical History:  Diagnosis Date  . Asthma   . Chronic kidney disease    stones  . Eczema   . Headache(784.0)    migraines  . Hypertension   . PONV (postoperative nausea and vomiting)       Objective: Physical Exam General: The patient is alert and oriented x3 in no acute distress.  Dermatology: Hyperkeratotic lesion(s) present on the left sub-fifth MPJ. Pain on palpation with a central nucleated core noted. Skin is cool, dry and supple bilateral lower extremities. Negative for open lesions or macerations.  Vascular: Palpable pedal pulses bilaterally. No edema or erythema noted. Capillary refill within normal limits.  Neurological: Epicritic and protective threshold grossly intact bilaterally.   Musculoskeletal Exam: All pedal and ankle joints range of motion within normal limits bilateral. Muscle strength 5/5 in all groups bilateral. Hammertoe contracture deformity noted to the 5th digit of the left foot.  Assessment: 1. 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 Porokeratosis left sub-fifth MPJ   Plan of Care:  1. Patient evaluated.  2. Excisional debridement of keratotic lesion(s) using a chisel blade was performed without incident. Salinocaine applied and light dressing placed. 3. Continue conservative treatment for hammertoe contracture.  4. Recommended good shoe gear.  5. Return to clinic as needed.     Edrick Kins, DPM [facility]  Dr. Edrick Kins, DPM    [address], [place] [address]                Office [phone]  Fax [phone]

## 2019-07-31 ENCOUNTER — Encounter: Payer: PRIVATE HEALTH INSURANCE | Admitting: Podiatry

## 2019-08-12 ENCOUNTER — Encounter: Payer: PRIVATE HEALTH INSURANCE | Admitting: Podiatry

## 2019-08-28 ENCOUNTER — Encounter: Payer: PRIVATE HEALTH INSURANCE | Admitting: Podiatry

## 2019-10-11 ENCOUNTER — Other Ambulatory Visit: Payer: Self-pay | Admitting: Family Medicine

## 2020-11-09 ENCOUNTER — Other Ambulatory Visit: Payer: Self-pay

## 2020-11-09 ENCOUNTER — Encounter: Payer: Self-pay | Admitting: Emergency Medicine

## 2020-11-09 ENCOUNTER — Ambulatory Visit: Payer: PRIVATE HEALTH INSURANCE | Admitting: Emergency Medicine

## 2020-11-09 VITALS — BP 112/70 | HR 77 | Temp 98.1°F | Ht 65.0 in | Wt 238.0 lb

## 2020-11-09 DIAGNOSIS — Z1211 Encounter for screening for malignant neoplasm of colon: Secondary | ICD-10-CM

## 2020-11-09 DIAGNOSIS — H04123 Dry eye syndrome of bilateral lacrimal glands: Secondary | ICD-10-CM | POA: Insufficient documentation

## 2020-11-09 DIAGNOSIS — Z872 Personal history of diseases of the skin and subcutaneous tissue: Secondary | ICD-10-CM

## 2020-11-09 DIAGNOSIS — Z7689 Persons encountering health services in other specified circumstances: Secondary | ICD-10-CM

## 2020-11-09 DIAGNOSIS — R7303 Prediabetes: Secondary | ICD-10-CM

## 2020-11-09 DIAGNOSIS — L508 Other urticaria: Secondary | ICD-10-CM

## 2020-11-09 DIAGNOSIS — Z6839 Body mass index (BMI) 39.0-39.9, adult: Secondary | ICD-10-CM

## 2020-11-09 LAB — COMPREHENSIVE METABOLIC PANEL
ALT: 15 U/L (ref 0–35)
AST: 20 U/L (ref 0–37)
Albumin: 3.6 g/dL (ref 3.5–5.2)
Alkaline Phosphatase: 63 U/L (ref 39–117)
BUN: 17 mg/dL (ref 6–23)
CO2: 27 mEq/L (ref 19–32)
Calcium: 9.4 mg/dL (ref 8.4–10.5)
Chloride: 103 mEq/L (ref 96–112)
Creatinine, Ser: 0.91 mg/dL (ref 0.40–1.20)
GFR: 74 mL/min (ref >60.00)
Glucose, Bld: 103 mg/dL — ABNORMAL HIGH (ref 70–99)
Potassium: 3.4 mEq/L — ABNORMAL LOW (ref 3.5–5.1)
Sodium: 135 mEq/L (ref 135–145)
Total Bilirubin: 0.2 mg/dL (ref 0.2–1.2)
Total Protein: 8.9 g/dL — ABNORMAL HIGH (ref 6.0–8.3)

## 2020-11-09 LAB — CBC WITH DIFFERENTIAL/PLATELET
Basophils Absolute: 0 10*3/uL (ref 0.0–0.1)
Basophils Relative: 1 % (ref 0.0–3.0)
Eosinophils Absolute: 0.1 10*3/uL (ref 0.0–0.7)
Eosinophils Relative: 3.2 % (ref 0.0–5.0)
HCT: 35.4 % — ABNORMAL LOW (ref 36.0–46.0)
Hemoglobin: 11.5 g/dL — ABNORMAL LOW (ref 12.0–15.0)
Lymphocytes Relative: 32.5 % (ref 12.0–46.0)
Lymphs Abs: 1.4 10*3/uL (ref 0.7–4.0)
MCHC: 32.4 g/dL (ref 30.0–36.0)
MCV: 86.8 fl (ref 78.0–100.0)
Monocytes Absolute: 0.5 10*3/uL (ref 0.1–1.0)
Monocytes Relative: 11.9 % (ref 3.0–12.0)
Neutro Abs: 2.2 10*3/uL (ref 1.4–7.7)
Neutrophils Relative %: 51.4 % (ref 43.0–77.0)
Platelets: 230 10*3/uL (ref 150.0–400.0)
RBC: 4.08 Mil/uL (ref 3.87–5.11)
RDW: 14.4 % (ref 11.5–15.5)
WBC: 4.2 10*3/uL (ref 4.0–10.5)

## 2020-11-09 LAB — LIPID PANEL
Cholesterol: 151 mg/dL (ref 0–200)
HDL: 51.9 mg/dL (ref >39.00)
LDL Cholesterol: 84 mg/dL (ref 0–99)
NonHDL: 99.15
Total CHOL/HDL Ratio: 3
Triglycerides: 76 mg/dL (ref 0.0–149.0)
VLDL: 15.2 mg/dL (ref 0.0–40.0)

## 2020-11-09 LAB — HEMOGLOBIN A1C: Hgb A1c MFr Bld: 6.2 % (ref 4.6–6.5)

## 2020-11-09 LAB — TSH: TSH: 1.24 u[IU]/mL (ref 0.35–5.50)

## 2020-11-09 MED ORDER — METFORMIN HCL 500 MG PO TABS: 500.0000 mg | ORAL_TABLET | Freq: Two times a day (BID) | ORAL | 3 refills | Status: DC

## 2020-11-09 NOTE — Patient Instructions (Signed)
Health Maintenance, Female Adopting a healthy lifestyle and getting preventive care are important in promoting health and wellness. Ask your health care provider about: The right schedule for you to have regular tests and exams. Things you can do on your own to prevent diseases and keep yourself healthy. What should I know about diet, weight, and exercise? Eat a healthy diet  Eat a diet that includes plenty of vegetables, fruits, low-fat dairy products, and lean protein. Do not eat a lot of foods that are high in solid fats, added sugars, or sodium. Maintain a healthy weight Body mass index (BMI) is used to identify weight problems. It estimates body fat based on height and weight. Your health care provider can help determine your BMI and help you achieve or maintain a healthy weight. Get regular exercise Get regular exercise. This is one of the most important things you can do for your health. Most adults should: Exercise for at least 150 minutes each week. The exercise should increase your heart rate and make you sweat (moderate-intensity exercise). Do strengthening exercises at least twice a week. This is in addition to the moderate-intensity exercise. Spend less time sitting. Even light physical activity can be beneficial. Watch cholesterol and blood lipids Have your blood tested for lipids and cholesterol at 49 years of age, then have this test every 5 years. Have your cholesterol levels checked more often if: Your lipid or cholesterol levels are high. You are older than 49 years of age. You are at high risk for heart disease. What should I know about cancer screening? Depending on your health history and family history, you may need to have cancer screening at various ages. This may include screening for: Breast cancer. Cervical cancer. Colorectal cancer. Skin cancer. Lung cancer. What should I know about heart disease, diabetes, and high blood pressure? Blood pressure and heart  disease High blood pressure causes heart disease and increases the risk of stroke. This is more likely to develop in people who have high blood pressure readings, are of African descent, or are overweight. Have your blood pressure checked: Every 3-5 years if you are 18-39 years of age. Every year if you are 40 years old or older. Diabetes Have regular diabetes screenings. This checks your fasting blood sugar level. Have the screening done: Once every three years after age 40 if you are at a normal weight and have a low risk for diabetes. More often and at a younger age if you are overweight or have a high risk for diabetes. What should I know about preventing infection? Hepatitis B If you have a higher risk for hepatitis B, you should be screened for this virus. Talk with your health care provider to find out if you are at risk for hepatitis B infection. Hepatitis C Testing is recommended for: Everyone born from 1945 through 1965. Anyone with known risk factors for hepatitis C. Sexually transmitted infections (STIs) Get screened for STIs, including gonorrhea and chlamydia, if: You are sexually active and are younger than 49 years of age. You are older than 49 years of age and your health care provider tells you that you are at risk for this type of infection. Your sexual activity has changed since you were last screened, and you are at increased risk for chlamydia or gonorrhea. Ask your health care provider if you are at risk. Ask your health care provider about whether you are at high risk for HIV. Your health care provider may recommend a prescription medicine   to help prevent HIV infection. If you choose to take medicine to prevent HIV, you should first get tested for HIV. You should then be tested every 3 months for as long as you are taking the medicine. Pregnancy If you are about to stop having your period (premenopausal) and you may become pregnant, seek counseling before you get  pregnant. Take 400 to 800 micrograms (mcg) of folic acid every day if you become pregnant. Ask for birth control (contraception) if you want to prevent pregnancy. Osteoporosis and menopause Osteoporosis is a disease in which the bones lose minerals and strength with aging. This can result in bone fractures. If you are 65 years old or older, or if you are at risk for osteoporosis and fractures, ask your health care provider if you should: Be screened for bone loss. Take a calcium or vitamin D supplement to lower your risk of fractures. Be given hormone replacement therapy (HRT) to treat symptoms of menopause. Follow these instructions at home: Lifestyle Do not use any products that contain nicotine or tobacco, such as cigarettes, e-cigarettes, and chewing tobacco. If you need help quitting, ask your health care provider. Do not use street drugs. Do not share needles. Ask your health care provider for help if you need support or information about quitting drugs. Alcohol use Do not drink alcohol if: Your health care provider tells you not to drink. You are pregnant, may be pregnant, or are planning to become pregnant. If you drink alcohol: Limit how much you use to 0-1 drink a day. Limit intake if you are breastfeeding. Be aware of how much alcohol is in your drink. In the [place] one drink equals one 12 oz bottle of beer (355 mL), one 5 oz glass of wine (148 mL), or one 1 oz glass of hard liquor (44 mL). General instructions Schedule regular health, dental, and eye exams. Stay current with your vaccines. Tell your health care provider if: You often feel depressed. You have ever been abused or do not feel safe at home. Summary Adopting a healthy lifestyle and getting preventive care are important in promoting health and wellness. Follow your health care provider's instructions about healthy diet, exercising, and getting tested or screened for diseases. Follow your health care provider's  instructions on monitoring your cholesterol and blood pressure. This information is not intended to replace advice given to you by your health care provider. Make sure you discuss any questions you have with your health care provider. Document Revised: 04/17/2020 Document Reviewed: 01/31/2018 Elsevier Patient Education  2022 [employer]  

## 2020-11-09 NOTE — Progress Notes (Signed)
Misty Barnes 50 y.o.   Chief Complaint  Patient presents with   New Patient (Initial Visit)    Discuss weight loss    HISTORY OF PRESENT ILLNESS: This is a 50 y.o. female first visit to this office here to establish care with me. Interested in losing weight. Has history of atopic dermatitis, sees dermatologist on a regular basis. Has history of dry eyes, sees ophthalmologist on a regular basis.  HPI   Prior to Admission medications   Medication Sig Start Date End Date Taking? Authorizing Provider  acetaminophen (TYLENOL) 500 MG tablet Take 500 mg by mouth every 6 (six) hours as needed.     Yes [provider]  albuterol (PROAIR HFA) 108 (90 Base) MCG/ACT inhaler Inhale 4 puffs every 4-6 hours as needed for cough, wheeze or shortness of breath 02/25/19  Yes Raylene Everts, MD  Azelastine HCl 0.15 % SOLN Place 2 sprays into both nostrils 2 (two) times daily. 01/12/18  Yes Ambs, Kathrine Cords, FNP  Budesonide POWD 1 application by Does not apply route 3 (three) times daily. Budesonide Cream- apply To the face   Yes [provider]  budesonide-formoterol (SYMBICORT) 80-4.5 MCG/ACT inhaler Inhale 2 puffs into the lungs 2 (two) times daily. 06/15/18  Yes Ambs, Kathrine Cords, FNP  doxycycline (VIBRAMYCIN) 50 MG capsule Take 50 mg by mouth 2 (two) times daily. 06/01/19  Yes [provider]  [place] 300 MG/2ML prefilled syringe  04/24/19  Yes [provider]  EPINEPHrine 0.3 mg/0.3 mL IJ SOAJ injection Use as directed for severe allergic reaction 02/19/18  Yes Valentina Shaggy, MD  fexofenadine [facility]) 180 MG tablet Take 2 tablets in the morning as needed 01/12/18  Yes [provider]  hydrOXYzine (VISTARIL) 25 MG capsule Take 25-50 mg by mouth 2 (two) times daily.    Yes [provider]  ipratropium-albuterol (DUONEB) 0.5-2.5 (3) MG/3ML SOLN Take 3 mLs by nebulization.   Yes [provider]  Loteprednol Etabonate 0.5 % OINT Apply to  eye. 09/03/18  Yes [provider]  montelukast (SINGULAIR) 10 MG tablet Take 1 tablet (10 mg total) by mouth at bedtime. 12/31/15  Yes Padgett, Rae Halsted, MD  DULoxetine (CYMBALTA) 60 MG capsule Take 60 mg by mouth daily.    05/10/11  [provider]    Allergies  Allergen Reactions   Fish Allergy Anaphylaxis   Penicillins Anaphylaxis   Latex    Percocet [Oxycodone-Acetaminophen] Nausea And Vomiting   Plaquenil [Hydroxychloroquine Sulfate]     rash   Sulfa Antibiotics     Unknown reaction    Patient Active Problem List   Diagnosis Date Noted   Chronic urticaria 06/15/2018   Sjogren's syndrome with keratoconjunctivitis sicca ([place]) 03/28/2018   Idiopathic urticaria 01/12/2018   Flexural atopic dermatitis 12/31/2015   Moderate persistent asthma, uncomplicated 123456   Obesity (BMI 35.0-39.9 without comorbidity) 12/15/2011   Hidradenitis suppurativa of right axilla 10/26/2011   Vitamin D deficiency 05/10/2011    Past Medical History:  Diagnosis Date   Asthma    Chronic kidney disease    stones   Eczema    Headache(784.0)    migraines   Hypertension    PONV (postoperative nausea and vomiting)     Past Surgical History:  Procedure Laterality Date   ABDOMINAL HYSTERECTOMY     CESAREAN SECTION     x 2   CYSTOSCOPY  01/10/2011   Procedure: CYSTOSCOPY;  Surgeon: Lubertha South Romine;  Location: [facility];  Service: Gynecology;  Laterality: N/A;   ENDOMETRIAL ABLATION  2005   HERNIA REPAIR     LIPOMA EXCISION     cs    Social History   Socioeconomic History   Marital status: Married    Spouse name: Not on file   Number of children: Not on file   Years of education: Not on file   Highest education level: Not on file  Occupational History   Not on file  Tobacco Use   Smoking status: Never   Smokeless tobacco: Never  Vaping Use   Vaping Use: Never used  Substance and Sexual Activity   Alcohol use: No    Comment: occ   Drug use: No    Sexual activity: Not Currently  Other Topics Concern   Not on file  Social History Narrative   Not on file   Social Determinants of Health   Financial Resource Strain: Not on file  Food Insecurity: Not on file  Transportation Needs: Not on file  Physical Activity: Not on file  Stress: Not on file  Social Connections: Not on file  Intimate Partner Violence: Not on file    Family History  Problem Relation Age of Onset   Hypertension Father    Multiple sclerosis Mother    Allergic rhinitis Paternal Grandmother    Asthma Paternal Grandmother    Allergic rhinitis Paternal Aunt    Asthma Paternal Aunt    Allergies Brother    Healthy Brother    Healthy Son    Healthy Daughter      Review of Systems  Constitutional: Negative.  Negative for chills and fever.  HENT:  Negative for congestion and sore throat.   Respiratory: Negative.  Negative for cough and shortness of breath.   Cardiovascular: Negative.  Negative for chest pain and palpitations.  Gastrointestinal:  Negative for abdominal pain, diarrhea, nausea and vomiting.  Genitourinary: Negative.  Negative for dysuria and hematuria.  Musculoskeletal:  Negative for myalgias and neck pain.  Skin: Negative.  Negative for rash.  Neurological:  Negative for dizziness and headaches.  All other systems reviewed and are negative.  Vitals:   11/09/20 1322  BP: 112/70  Pulse: 77  Temp: 98.1 F (36.7 C)  SpO2: 95%    Physical Exam Vitals reviewed.  Constitutional:      Appearance: Normal appearance. She is obese.  HENT:     Head: Normocephalic.     Right Ear: Tympanic membrane, ear canal and external ear normal.     Left Ear: Tympanic membrane, ear canal and external ear normal.     Mouth/Throat:     Mouth: Mucous membranes are moist.     Pharynx: Oropharynx is clear.  Eyes:     Extraocular Movements: Extraocular movements intact.     Conjunctiva/sclera: Conjunctivae normal.     Pupils: Pupils are equal, round, and  reactive to light.  Cardiovascular:     Rate and Rhythm: Normal rate and regular rhythm.     Pulses: Normal pulses.     Heart sounds: Normal heart sounds.  Pulmonary:     Effort: Pulmonary effort is normal.     Breath sounds: Normal breath sounds.  Musculoskeletal:        General: Normal range of motion.     Cervical back: Normal range of motion and neck supple. No tenderness.     Right lower leg: No edema.     Left lower leg: No edema.  Lymphadenopathy:     Cervical:  No cervical adenopathy.  Skin:    General: Skin is warm and dry.     Capillary Refill: Capillary refill takes less than 2 seconds.  Neurological:     General: No focal deficit present.     Mental Status: She is alert and oriented to person, place, and time.  Psychiatric:        Mood and Affect: Mood normal.        Behavior: Behavior normal.   Results for orders placed or performed in visit on 11/09/20 (from the past 24 hour(s))  Comprehensive metabolic panel     Status: Abnormal   Collection Time: 11/09/20  1:59 PM  Result Value Ref Range   Sodium 135 135 - 145 mEq/L   Potassium 3.4 (L) 3.5 - 5.1 mEq/L   Chloride 103 96 - 112 mEq/L   CO2 27 19 - 32 mEq/L   Glucose, Bld 103 (H) 70 - 99 mg/dL   BUN 17 6 - 23 mg/dL   Creatinine, Ser 0.91 0.40 - 1.20 mg/dL   Total Bilirubin 0.2 0.2 - 1.2 mg/dL   Alkaline Phosphatase 63 39 - 117 U/L   AST 20 0 - 37 U/L   ALT 15 0 - 35 U/L   Total Protein 8.9 (H) 6.0 - 8.3 g/dL   Albumin 3.6 3.5 - 5.2 g/dL   GFR 74.00 >60.00 mL/min   Calcium 9.4 8.4 - 10.5 mg/dL  CBC with Differential/Platelet     Status: Abnormal   Collection Time: 11/09/20  1:59 PM  Result Value Ref Range   WBC 4.2 4.0 - 10.5 K/uL   RBC 4.08 3.87 - 5.11 Mil/uL   Hemoglobin 11.5 (L) 12.0 - 15.0 g/dL   HCT 35.4 (L) 36.0 - 46.0 %   MCV 86.8 78.0 - 100.0 fl   MCHC 32.4 30.0 - 36.0 g/dL   RDW 14.4 11.5 - 15.5 %   Platelets 230.0 150.0 - 400.0 K/uL   Neutrophils Relative % 51.4 43.0 - 77.0 %   Lymphocytes  Relative 32.5 12.0 - 46.0 %   Monocytes Relative 11.9 3.0 - 12.0 %   Eosinophils Relative 3.2 0.0 - 5.0 %   Basophils Relative 1.0 0.0 - 3.0 %   Neutro Abs 2.2 1.4 - 7.7 K/uL   Lymphs Abs 1.4 0.7 - 4.0 K/uL   Monocytes Absolute 0.5 0.1 - 1.0 K/uL   Eosinophils Absolute 0.1 0.0 - 0.7 K/uL   Basophils Absolute 0.0 0.0 - 0.1 K/uL  TSH     Status: None   Collection Time: 11/09/20  1:59 PM  Result Value Ref Range   TSH 1.24 0.35 - 5.50 uIU/mL  Hemoglobin A1c     Status: None   Collection Time: 11/09/20  1:59 PM  Result Value Ref Range   Hgb A1c MFr Bld 6.2 4.6 - 6.5 %  Lipid panel     Status: None   Collection Time: 11/09/20  1:59 PM  Result Value Ref Range   Cholesterol 151 0 - 200 mg/dL   Triglycerides 76.0 0.0 - 149.0 mg/dL   HDL 51.90 >39.00 mg/dL   VLDL 15.2 0.0 - 40.0 mg/dL   LDL Cholesterol 84 0 - 99 mg/dL   Total CHOL/HDL Ratio 3    NonHDL 99.15      ASSESSMENT & PLAN: Chronic urticaria Stable.  Well-controlled.  Sees dermatologist on a regular basis.  Body mass index (BMI) of 39.0-39.9 in adult Diet and nutrition discussed.  Advised to decrease amount of daily carbohydrate  intake.  Referred to medical weight management clinic. Blood work done today.  May benefit from metformin and or Ozempic.  We will decide after evaluating blood results.  Prediabetes Hemoglobin A1c is 6.2.  Diet and nutrition discussed. Will start metformin 500 mg twice a day with food.  It may help with weight loss as well.  Advised to decrease amount of daily carbohydrate intake.  Tye was seen today for new patient (initial visit).  Diagnoses and all orders for this visit:  Body mass index (BMI) of 39.0-39.9 in adult -     Comprehensive metabolic panel -     CBC with Differential/Platelet -     TSH -     Hemoglobin A1c -     Lipid panel -     Amb Ref to Medical Weight Management  Encounter to establish care  History of atopic dermatitis  Dry eyes  Colon cancer screening -      Ambulatory referral to Gastroenterology  Chronic urticaria  Prediabetes -     metFORMIN (GLUCOPHAGE) 500 MG tablet; Take 1 tablet (500 mg total) by mouth 2 (two) times daily with a meal.   Patient Instructions  Health Maintenance, Female Adopting a healthy lifestyle and getting preventive care are important in promoting health and wellness. Ask your health care provider about: The right schedule for you to have regular tests and exams. Things you can do on your own to prevent diseases and keep yourself healthy. What should I know about diet, weight, and exercise? Eat a healthy diet  Eat a diet that includes plenty of vegetables, fruits, low-fat dairy products, and lean protein. Do not eat a lot of foods that are high in solid fats, added sugars, or sodium. Maintain a healthy weight Body mass index (BMI) is used to identify weight problems. It estimates body fat based on height and weight. Your health care provider can help determine your BMI and help you achieve or maintain a healthy weight. Get regular exercise Get regular exercise. This is one of the most important things you can do for your health. Most adults should: Exercise for at least 150 minutes each week. The exercise should increase your heart rate and make you sweat (moderate-intensity exercise). Do strengthening exercises at least twice a week. This is in addition to the moderate-intensity exercise. Spend less time sitting. Even light physical activity can be beneficial. Watch cholesterol and blood lipids Have your blood tested for lipids and cholesterol at 49 years of age, then have this test every 5 years. Have your cholesterol levels checked more often if: Your lipid or cholesterol levels are high. You are older than 49 years of age. You are at high risk for heart disease. What should I know about cancer screening? Depending on your health history and family history, you may need to have cancer screening at various  ages. This may include screening for: Breast cancer. Cervical cancer. Colorectal cancer. Skin cancer. Lung cancer. What should I know about heart disease, diabetes, and high blood pressure? Blood pressure and heart disease High blood pressure causes heart disease and increases the risk of stroke. This is more likely to develop in people who have high blood pressure readings, are of African descent, or are overweight. Have your blood pressure checked: Every 3-5 years if you are 13-34 years of age. Every year if you are 55 years old or older. Diabetes Have regular diabetes screenings. This checks your fasting blood sugar level. Have the screening done: Once every  three years after age 69 if you are at a normal weight and have a low risk for diabetes. More often and at a younger age if you are overweight or have a high risk for diabetes. What should I know about preventing infection? Hepatitis B If you have a higher risk for hepatitis B, you should be screened for this virus. Talk with your health care provider to find out if you are at risk for hepatitis B infection. Hepatitis C Testing is recommended for: Everyone born from [birth year] through 1964. Anyone with known risk factors for hepatitis C. Sexually transmitted infections (STIs) Get screened for STIs, including gonorrhea and chlamydia, if: You are sexually active and are younger than 49 years of age. You are older than 49 years of age and your health care provider tells you that you are at risk for this type of infection. Your sexual activity has changed since you were last screened, and you are at increased risk for chlamydia or gonorrhea. Ask your health care provider if you are at risk. Ask your health care provider about whether you are at high risk for HIV. Your health care provider may recommend a prescription medicine to help prevent HIV infection. If you choose to take medicine to prevent HIV, you should first get tested for HIV.  You should then be tested every 3 months for as long as you are taking the medicine. Pregnancy If you are about to stop having your period (premenopausal) and you may become pregnant, seek counseling before you get pregnant. Take 400 to 800 micrograms (mcg) of folic acid every day if you become pregnant. Ask for birth control (contraception) if you want to prevent pregnancy. Osteoporosis and menopause Osteoporosis is a disease in which the bones lose minerals and strength with aging. This can result in bone fractures. If you are 12 years old or older, or if you are at risk for osteoporosis and fractures, ask your health care provider if you should: Be screened for bone loss. Take a calcium or vitamin D supplement to lower your risk of fractures. Be given hormone replacement therapy (HRT) to treat symptoms of menopause. Follow these instructions at home: Lifestyle Do not use any products that contain nicotine or tobacco, such as cigarettes, e-cigarettes, and chewing tobacco. If you need help quitting, ask your health care provider. Do not use street drugs. Do not share needles. Ask your health care provider for help if you need support or information about quitting drugs. Alcohol use Do not drink alcohol if: Your health care provider tells you not to drink. You are pregnant, may be pregnant, or are planning to become pregnant. If you drink alcohol: Limit how much you use to 0-1 drink a day. Limit intake if you are breastfeeding. Be aware of how much alcohol is in your drink. In the [place] one drink equals one 12 oz bottle of beer (355 mL), one 5 oz glass of wine (148 mL), or one 1 oz glass of hard liquor (44 mL). General instructions Schedule regular health, dental, and eye exams. Stay current with your vaccines. Tell your health care provider if: You often feel depressed. You have ever been abused or do not feel safe at home. Summary Adopting a healthy lifestyle and getting preventive  care are important in promoting health and wellness. Follow your health care provider's instructions about healthy diet, exercising, and getting tested or screened for diseases. Follow your health care provider's instructions on monitoring your cholesterol and blood pressure. This  information is not intended to replace advice given to you by your health care provider. Make sure you discuss any questions you have with your health care provider. Document Revised: 04/17/2020 Document Reviewed: 01/31/2018 Elsevier Patient Education  2022 [place], MD [facility]

## 2020-11-09 NOTE — Assessment & Plan Note (Signed)
Stable.  Well-controlled.  Sees dermatologist on a regular basis.

## 2020-11-09 NOTE — Assessment & Plan Note (Signed)
Diet and nutrition discussed.  Advised to decrease amount of daily carbohydrate intake.  Referred to medical weight management clinic. Blood work done today.  May benefit from metformin and or Ozempic.  We will decide after evaluating blood results.

## 2020-11-09 NOTE — Assessment & Plan Note (Signed)
Hemoglobin A1c is 6.2.  Diet and nutrition discussed. Will start metformin 500 mg twice a day with food.  It may help with weight loss as well.  Advised to decrease amount of daily carbohydrate intake.

## 2020-11-16 ENCOUNTER — Encounter: Payer: Self-pay | Admitting: Gastroenterology

## 2020-12-17 ENCOUNTER — Other Ambulatory Visit: Payer: Self-pay

## 2020-12-17 ENCOUNTER — Ambulatory Visit (AMBULATORY_SURGERY_CENTER): Payer: PRIVATE HEALTH INSURANCE

## 2020-12-17 VITALS — Ht 65.0 in | Wt 234.0 lb

## 2020-12-17 DIAGNOSIS — Z1211 Encounter for screening for malignant neoplasm of colon: Secondary | ICD-10-CM

## 2020-12-17 MED ORDER — SUTAB 1479-225-188 MG PO TABS: 12.0000 | ORAL_TABLET | ORAL | 0 refills | Status: DC

## 2020-12-17 NOTE — Progress Notes (Signed)
     Patient's pre-visit was done today over the phone with the patient   Name,DOB and address verified.   Patient denies any allergies to Eggs if cooked in foods and Soy.  Patient denies any problems with anesthesia/sedation. Patient denies taking diet pills or blood thinners.  Denies atrial flutter or atrial fib Denies chronic constipation No home Oxygen.   Packet of Prep instructions mailed to patient including a copy of a consent form-pt is aware.  Patient understands to call us back with any questions or concerns.  Patient is aware of our care-partner policy and [id number] safety protocol.   EMMI education assigned to the patient for the procedure, sent to [facility].   The patient is COVID-19 vaccinated.

## 2020-12-22 ENCOUNTER — Encounter: Payer: Self-pay | Admitting: Gastroenterology

## 2021-01-01 ENCOUNTER — Encounter: Payer: PRIVATE HEALTH INSURANCE | Admitting: Gastroenterology

## 2021-01-08 ENCOUNTER — Encounter: Payer: Self-pay | Admitting: Gastroenterology

## 2021-01-08 ENCOUNTER — Ambulatory Visit (AMBULATORY_SURGERY_CENTER): Payer: No Typology Code available for payment source | Admitting: Gastroenterology

## 2021-01-08 VITALS — BP 117/73 | HR 67 | Temp 98.0°F | Resp 12 | Ht 65.0 in | Wt 234.0 lb

## 2021-01-08 DIAGNOSIS — Z1211 Encounter for screening for malignant neoplasm of colon: Secondary | ICD-10-CM | POA: Diagnosis present

## 2021-01-08 DIAGNOSIS — D127 Benign neoplasm of rectosigmoid junction: Secondary | ICD-10-CM

## 2021-01-08 DIAGNOSIS — D128 Benign neoplasm of rectum: Secondary | ICD-10-CM

## 2021-01-08 MED ORDER — SODIUM CHLORIDE 0.9 % IV SOLN: 500.0000 mL | Freq: Once | INTRAVENOUS | Status: DC

## 2021-01-08 NOTE — Progress Notes (Signed)
Called to room to assist during endoscopic procedure.  Patient ID and intended procedure confirmed with present staff. Received instructions for my participation in the procedure from the performing physician.  

## 2021-01-08 NOTE — Progress Notes (Signed)
Pt's states no medical or surgical changes since previsit or office visit.   CHECK-IN-AM  V/S-DT  Made Dr. Havery Moros and CRNA aware of blood sugar of 77  prior to procedure no new orders received.

## 2021-01-08 NOTE — Patient Instructions (Signed)
Information on polyps and diverticulosis given to you today.  Await pathology results.  Resume previous diet and medications.   Information on polyps and diverticulosis given to you todayYOU HAD AN ENDOSCOPIC PROCEDURE TODAY AT [facility]:   Refer to the procedure report that was given to you for any specific questions about what was found during the examination.  If the procedure report does not answer your questions, please call your gastroenterologist to clarify.  If you requested that your care partner not be given the details of your procedure findings, then the procedure report has been included in a sealed envelope for you to review at your convenience later.  YOU SHOULD EXPECT: Some feelings of bloating in the abdomen. Passage of more gas than usual.  Walking can help get rid of the air that was put into your GI tract during the procedure and reduce the bloating. If you had a lower endoscopy (such as a colonoscopy or flexible sigmoidoscopy) you may notice spotting of blood in your stool or on the toilet paper. If you underwent a bowel prep for your procedure, you may not have a normal bowel movement for a few days.  Please Note:  You might notice some irritation and congestion in your nose or some drainage.  This is from the oxygen used during your procedure.  There is no need for concern and it should clear up in a day or so.  SYMPTOMS TO REPORT IMMEDIATELY:  Following lower endoscopy (colonoscopy or flexible sigmoidoscopy):  Excessive amounts of blood in the stool  Significant tenderness or worsening of abdominal pains  Swelling of the abdomen that is new, acute  Fever of 100F or higher  For urgent or emergent issues, a gastroenterologist can be reached at any hour by calling [phone]. Do not use MyChart messaging for urgent concerns.    DIET:  We do recommend a small meal at first, but then you may proceed to your regular diet.  Drink plenty of fluids but  you should avoid alcoholic beverages for 24 hours.  ACTIVITY:  You should plan to take it easy for the rest of today and you should NOT DRIVE or use heavy machinery until tomorrow (because of the sedation medicines used during the test).    FOLLOW UP: Our staff will call the number listed on your records 48-72 hours following your procedure to check on you and address any questions or concerns that you may have regarding the information given to you following your procedure. If we do not reach you, we will leave a message.  We will attempt to reach you two times.  During this call, we will ask if you have developed any symptoms of COVID 19. If you develop any symptoms (ie: fever, flu-like symptoms, shortness of breath, cough etc.) before then, please call [phone].  If you test positive for Covid 19 in the 2 weeks post procedure, please call and report this information to [place].    If any biopsies were taken you will be contacted by phone or by letter within the next 1-3 weeks.  Please call us at [phone] if you have not heard about the biopsies in 3 weeks.    SIGNATURES/CONFIDENTIALITY: You and/or your care partner have signed paperwork which will be entered into your electronic medical record.  These signatures attest to the fact that that the information above on your After Visit Summary has been reviewed and is understood.  Full responsibility of the confidentiality of  this discharge information lies with you and/or your care-partner.

## 2021-01-08 NOTE — Op Note (Signed)
[place] Patient Name: Misty Barnes Procedure Date: 01/08/2021 2:41 PM MRN: [id number] Endoscopist: Remo Lipps P. Havery Moros , MD Age: 50 Referring MD:  Date of Birth: Apr 03, 1970 Gender: Female Account #: [id number] Procedure:                Colonoscopy Indications:              Screening for colorectal malignant neoplasm, This                            is the patient's first colonoscopy Medicines:                Monitored Anesthesia Care Procedure:                Pre-Anesthesia Assessment:                           - Prior to the procedure, a History and Physical                            was performed, and patient medications and                            allergies were reviewed. The patient's tolerance of                            previous anesthesia was also reviewed. The risks                            and benefits of the procedure and the sedation                            options and risks were discussed with the patient.                            All questions were answered, and informed consent                            was obtained. Prior Anticoagulants: The patient has                            taken no previous anticoagulant or antiplatelet                            agents. ASA Grade Assessment: III - A patient with                            severe systemic disease. After reviewing the risks                            and benefits, the patient was deemed in                            satisfactory condition to undergo the procedure.  After obtaining informed consent, the colonoscope                            was passed under direct vision. Throughout the                            procedure, the patient's blood pressure, pulse, and                            oxygen saturations were monitored continuously. The                            CF [id number] #[id number] was introduced through the anus                            and advanced  to the the terminal ileum, with                            identification of the appendiceal orifice and IC                            valve. The colonoscopy was performed without                            difficulty. The patient tolerated the procedure                            well. The quality of the bowel preparation was                            adequate. The terminal ileum, ileocecal valve,                            appendiceal orifice, and rectum were photographed. Scope In: 2:45:43 PM Scope Out: 3:03:34 PM Scope Withdrawal Time: 0 hours 15 minutes 43 seconds  Total Procedure Duration: 0 hours 17 minutes 51 seconds  Findings:                 The perianal and digital rectal examinations were                            normal.                           The terminal ileum appeared normal.                           Multiple medium-mouthed diverticula were found in                            the sigmoid colon and ascending colon.                           A 3 mm polyp was found in the recto-sigmoid colon.  The polyp was sessile. The polyp was removed with a                            cold snare. Resection and retrieval were complete.                           Anal papilla(e) were hypertrophied.                           The exam was otherwise without abnormality. Complications:            No immediate complications. Estimated blood loss:                            Minimal. Estimated Blood Loss:     Estimated blood loss was minimal. Impression:               - The examined portion of the ileum was normal.                           - Diverticulosis in the sigmoid colon and in the                            ascending colon.                           - One 3 mm polyp at the recto-sigmoid colon,                            removed with a cold snare. Resected and retrieved.                           - Anal papilla(e) were hypertrophied.                            - The examination was otherwise normal. Recommendation:           - Patient has a contact number available for                            emergencies. The signs and symptoms of potential                            delayed complications were discussed with the                            patient. Return to normal activities tomorrow.                            Written discharge instructions were provided to the                            patient.                           - Resume previous diet.                           -  Continue present medications.                           - Await pathology results. Remo Lipps P. Grey Rakestraw, MD 01/08/2021 3:07:49 PM This report has been signed electronically.

## 2021-01-08 NOTE — Progress Notes (Signed)
A and O x3. Report to RN. Tolerated MAC anesthesia well. 

## 2021-01-08 NOTE — Progress Notes (Signed)
[facility] History and Physical   Primary Care Physician:  Patient, No Pcp Per (Inactive)   Reason for Procedure:   Colon cancer screening  Plan:    colonoscopy     HPI: Misty Barnes is a 50 y.o. female  here for colonoscopy screening - first time exam. Patient denies any bowel symptoms at this time. No family history of colon cancer known. Otherwise feels well without any cardiopulmonary symptoms. Denies complaints otherwise.   Past Medical History:  Diagnosis Date   Allergy    seasonal   Asthma    Chronic kidney disease    stones   Diabetes mellitus without complication ([place])    prediabetic and on metformin for wt loss   Eczema    Headache(784.0)    migraines   Hypertension    PONV (postoperative nausea and vomiting)     Past Surgical History:  Procedure Laterality Date   ABDOMINAL HYSTERECTOMY     CESAREAN SECTION     x 2   CYSTOSCOPY  01/10/2011   Procedure: CYSTOSCOPY;  Surgeon: Lubertha South Romine;  Location: [facility];  Service: Gynecology;  Laterality: N/A;   ENDOMETRIAL ABLATION  2005   HERNIA REPAIR     LIPOMA EXCISION     cs    Prior to Admission medications   Medication Sig Start Date End Date Taking? Authorizing Provider  albuterol (PROAIR HFA) 108 (90 Base) MCG/ACT inhaler Inhale 4 puffs every 4-6 hours as needed for cough, wheeze or shortness of breath 02/25/19  Yes Raylene Everts, MD  desonide (DESOWEN) 0.05 % ointment APPLY TOPICALLY TO THE AFFECTED AREA UP TO TWICE DAILY AS NEEDED 03/10/20  Yes [provider]  doxycycline (VIBRAMYCIN) 50 MG capsule Take by mouth. 02/26/20  Yes [provider]  metFORMIN (GLUCOPHAGE) 500 MG tablet Take 1 tablet (500 mg total) by mouth 2 (two) times daily with a meal. 11/09/20  Yes Sagardia, Ines Bloomer, MD  prednisoLONE acetate (PRED FORTE) 1 % ophthalmic suspension 1 drop 4 (four) times daily. 10/13/20  Yes [provider]  PREVIDENT 5000 BOOSTER PLUS 1.1 % PSTE Place onto  teeth. 08/02/20  Yes [provider]  RESTASIS 0.05 % ophthalmic emulsion  12/02/20  Yes [provider]  valACYclovir (VALTREX) 500 MG tablet Take 1 tablet by mouth daily. 03/07/19  Yes [provider]  acetaminophen (TYLENOL) 500 MG tablet Take 500 mg by mouth every 6 (six) hours as needed.      [provider]  Azelastine HCl 0.15 % SOLN Place 2 sprays into both nostrils 2 (two) times daily. Patient not taking: Reported on 12/17/2020 01/12/18   [name], FNP  Budesonide POWD 1 application by Does not apply route 3 (three) times daily. Budesonide Cream- apply To the face Patient not taking: Reported on 12/17/2020    [provider]  [place] 300 MG/2ML prefilled syringe  04/24/19   [provider]  EPINEPHrine 0.3 mg/0.3 mL IJ SOAJ injection Use as directed for severe allergic reaction 02/19/18   Valentina Shaggy, MD  fexofenadine (ALLEGRA) 180 MG tablet Take 2 tablets in the morning as needed Patient not taking: Reported on 01/08/2021 01/12/18   [provider]  hydrOXYzine (VISTARIL) 25 MG capsule Take 25-50 mg by mouth 2 (two) times daily.  Patient not taking: Reported on 12/17/2020    [provider]  ipratropium-albuterol (DUONEB) 0.5-2.5 (3) MG/3ML SOLN Take 3 mLs by nebulization. Patient not taking: Reported on 12/17/2020  [provider]  ketoconazole (NIZORAL) 2 % cream Apply to areas around nose and eyebrows daily Patient not taking: Reported on 01/08/2021 03/10/20   [provider]  Loteprednol Etabonate 0.5 % OINT Apply to eye. Patient not taking: Reported on 12/17/2020 09/03/18   [provider]  montelukast (SINGULAIR) 10 MG tablet Take 1 tablet (10 mg total) by mouth at bedtime. Patient not taking: Reported on 12/17/2020 12/31/15   [name], MD  triamcinolone (KENALOG) 0.025 % cream Apply 1 application topically 2 (two) times daily. Patient not taking:  Reported on 01/08/2021    [provider]  DULoxetine (CYMBALTA) 60 MG capsule Take 60 mg by mouth daily.    05/10/11  [provider]    Current Outpatient Medications  Medication Sig Dispense Refill   albuterol (PROAIR HFA) 108 (90 Base) MCG/ACT inhaler Inhale 4 puffs every 4-6 hours as needed for cough, wheeze or shortness of breath 18 g 0   desonide (DESOWEN) 0.05 % ointment APPLY TOPICALLY TO THE AFFECTED AREA UP TO TWICE DAILY AS NEEDED     doxycycline (VIBRAMYCIN) 50 MG capsule Take by mouth.     metFORMIN (GLUCOPHAGE) 500 MG tablet Take 1 tablet (500 mg total) by mouth 2 (two) times daily with a meal. 180 tablet 3   prednisoLONE acetate (PRED FORTE) 1 % ophthalmic suspension 1 drop 4 (four) times daily.     PREVIDENT 5000 BOOSTER PLUS 1.1 % PSTE Place onto teeth.     RESTASIS 0.05 % ophthalmic emulsion      valACYclovir (VALTREX) 500 MG tablet Take 1 tablet by mouth daily.     acetaminophen (TYLENOL) 500 MG tablet Take 500 mg by mouth every 6 (six) hours as needed.       Azelastine HCl 0.15 % SOLN Place 2 sprays into both nostrils 2 (two) times daily. (Patient not taking: Reported on 12/17/2020) 30 mL 5   Budesonide POWD 1 application by Does not apply route 3 (three) times daily. Budesonide Cream- apply To the face (Patient not taking: Reported on 12/17/2020)     DUPIXENT 300 MG/2ML prefilled syringe      EPINEPHrine 0.3 mg/0.3 mL IJ SOAJ injection Use as directed for severe allergic reaction 2 Device 2   fexofenadine (ALLEGRA) 180 MG tablet Take 2 tablets in the morning as needed (Patient not taking: Reported on 01/08/2021)     hydrOXYzine (VISTARIL) 25 MG capsule Take 25-50 mg by mouth 2 (two) times daily.  (Patient not taking: Reported on 12/17/2020)     ipratropium-albuterol (DUONEB) 0.5-2.5 (3) MG/3ML SOLN Take 3 mLs by nebulization. (Patient not taking: Reported on 12/17/2020)     ketoconazole (NIZORAL) 2 % cream Apply to areas around nose and eyebrows daily  (Patient not taking: Reported on 01/08/2021)     Loteprednol Etabonate 0.5 % OINT Apply to eye. (Patient not taking: Reported on 12/17/2020)     montelukast (SINGULAIR) 10 MG tablet Take 1 tablet (10 mg total) by mouth at bedtime. (Patient not taking: Reported on 12/17/2020) 30 tablet 5   triamcinolone (KENALOG) 0.025 % cream Apply 1 application topically 2 (two) times daily. (Patient not taking: Reported on 01/08/2021)     Current Facility-Administered Medications  Medication Dose Route Frequency Provider Last Rate Last Admin   0.9 %  sodium chloride infusion  500 mL Intravenous Once Guenevere Roorda, Carlota Raspberry, MD       omalizumab Arvid Right) injection 300 mg  300 mg Subcutaneous Q28 days Valentina Shaggy, MD  300 mg at 11/19/18 1025    Allergies as of 01/08/2021 - Review Complete 01/08/2021  Allergen Reaction Noted   Fish allergy Anaphylaxis 03/14/2018   Penicillins Anaphylaxis 01/07/2011   Latex  05/20/2019   Percocet [oxycodone-acetaminophen] Nausea And Vomiting 01/15/2014   Plaquenil [hydroxychloroquine sulfate]  04/24/2018   Sulfa antibiotics  12/31/2015    Family History  Problem Relation Age of Onset   Multiple sclerosis Mother    Hypertension Father    Allergies Brother    Healthy Brother    Allergic rhinitis Paternal Aunt    Asthma Paternal Aunt    Allergic rhinitis Paternal Grandmother    Asthma Paternal Geophysicist/field seismologist    Healthy Daughter    Healthy Son    Colon cancer Neg Hx    Colon polyps Neg Hx    Esophageal cancer Neg Hx    Rectal cancer Neg Hx    Stomach cancer Neg Hx     Social History   Socioeconomic History   Marital status: Married    Spouse name: Not on file   Number of children: Not on file   Years of education: Not on file   Highest education level: Not on file  Occupational History   Not on file  Tobacco Use   Smoking status: Never   Smokeless tobacco: Never  Vaping Use   Vaping Use: Never used  Substance and Sexual Activity   Alcohol use: No     Comment: occ   Drug use: No   Sexual activity: Not Currently  Other Topics Concern   Not on file  Social History Narrative   Not on file   Social Determinants of Health   Financial Resource Strain: Not on file  Food Insecurity: Not on file  Transportation Needs: Not on file  Physical Activity: Not on file  Stress: Not on file  Social Connections: Not on file  Intimate Partner Violence: Not on file    Review of Systems: All other review of systems negative except as mentioned in the HPI.  Physical Exam: Vital signs BP (!) 92/55 (Patient Position: Sitting)   Pulse 69   Temp 98 F (36.7 C)   Ht 5\' 5"  (1.651 m)   Wt 234 lb (106.1 kg)   LMP 11/07/2010   SpO2 99%   BMI 38.94 kg/m   General:   Alert,  Well-developed, pleasant and cooperative in NAD Lungs:  Clear throughout to auscultation.   Heart:  Regular rate and rhythm Abdomen:  Soft, nontender and nondistended.   Neuro/Psych:  Alert and cooperative. Normal mood and affect. A and O x 3  Jolly Mango, MD [facility]

## 2021-01-12 ENCOUNTER — Telehealth: Payer: Self-pay | Admitting: *Deleted

## 2021-01-12 NOTE — Telephone Encounter (Signed)
  Follow up Call-  Call back number 01/08/2021  Post procedure Call Back phone  # [phone]  Permission to leave phone message Yes  Some recent data might be hidden     Patient questions:  Do you have a fever, pain , or abdominal swelling? No. Pain Score  0 *  Have you tolerated food without any problems? Yes.    Have you been able to return to your normal activities? Yes.    Do you have any questions about your discharge instructions: Diet   No. Medications  No. Follow up visit  No.  Do you have questions or concerns about your Care? No.  Actions: * If pain score is 4 or above: No action needed, pain <4.  Have you developed a fever since your procedure? no  2.   Have you had an respiratory symptoms (SOB or cough) since your procedure? no  3.   Have you tested positive for COVID 19 since your procedure no  4.   Have you had any family members/close contacts diagnosed with the COVID 19 since your procedure?  no   If yes to any of these questions please route to Joylene John, RN and Joella Prince, RN

## 2021-03-22 ENCOUNTER — Telehealth: Payer: Self-pay

## 2021-03-22 ENCOUNTER — Other Ambulatory Visit: Payer: Self-pay | Admitting: Emergency Medicine

## 2021-03-22 DIAGNOSIS — Z6839 Body mass index (BMI) 39.0-39.9, adult: Secondary | ICD-10-CM

## 2021-03-22 DIAGNOSIS — R7303 Prediabetes: Secondary | ICD-10-CM

## 2021-03-22 MED ORDER — OZEMPIC (0.25 OR 0.5 MG/DOSE) 2 MG/1.5ML ~~LOC~~ SOPN: 0.5000 mg | PEN_INJECTOR | SUBCUTANEOUS | 5 refills | Status: DC

## 2021-03-22 NOTE — Telephone Encounter (Signed)
Patient notified of medication switch and it being sent to pharmacy.

## 2021-03-22 NOTE — Telephone Encounter (Signed)
New prescription for Ozempic sent to pharmacy of record.  Stop metformin

## 2021-03-22 NOTE — Telephone Encounter (Signed)
Pt wanting to know if she can change from taking metFORMIN (GLUCOPHAGE) 500 MG tablet to Ozempic. It is causing bad diarrhea.     Callback #- [phone]

## 2021-05-11 ENCOUNTER — Ambulatory Visit: Payer: PRIVATE HEALTH INSURANCE | Admitting: Emergency Medicine

## 2021-05-20 ENCOUNTER — Ambulatory Visit (INDEPENDENT_AMBULATORY_CARE_PROVIDER_SITE_OTHER): Payer: No Typology Code available for payment source | Admitting: Emergency Medicine

## 2021-05-20 ENCOUNTER — Encounter: Payer: Self-pay | Admitting: Emergency Medicine

## 2021-05-20 VITALS — BP 106/66 | HR 78 | Temp 97.9°F | Ht 65.0 in | Wt 227.5 lb

## 2021-05-20 DIAGNOSIS — D649 Anemia, unspecified: Secondary | ICD-10-CM | POA: Diagnosis not present

## 2021-05-20 DIAGNOSIS — R7303 Prediabetes: Secondary | ICD-10-CM | POA: Diagnosis not present

## 2021-05-20 DIAGNOSIS — Z6839 Body mass index (BMI) 39.0-39.9, adult: Secondary | ICD-10-CM

## 2021-05-20 LAB — COMPREHENSIVE METABOLIC PANEL
ALT: 17 U/L (ref 0–35)
AST: 21 U/L (ref 0–37)
Albumin: 3.8 g/dL (ref 3.5–5.2)
Alkaline Phosphatase: 69 U/L (ref 39–117)
BUN: 17 mg/dL (ref 6–23)
CO2: 27 mEq/L (ref 19–32)
Calcium: 9.7 mg/dL (ref 8.4–10.5)
Chloride: 102 mEq/L (ref 96–112)
Creatinine, Ser: 0.87 mg/dL (ref 0.40–1.20)
GFR: 77.82 mL/min (ref >60.00)
Glucose, Bld: 83 mg/dL (ref 70–99)
Potassium: 3.7 mEq/L (ref 3.5–5.1)
Sodium: 135 mEq/L (ref 135–145)
Total Bilirubin: 0.3 mg/dL (ref 0.2–1.2)
Total Protein: 9.2 g/dL — ABNORMAL HIGH (ref 6.0–8.3)

## 2021-05-20 LAB — CBC WITH DIFFERENTIAL/PLATELET
Basophils Absolute: 0 10*3/uL (ref 0.0–0.1)
Basophils Relative: 0.5 % (ref 0.0–3.0)
Eosinophils Absolute: 0.1 10*3/uL (ref 0.0–0.7)
Eosinophils Relative: 3 % (ref 0.0–5.0)
HCT: 33.8 % — ABNORMAL LOW (ref 36.0–46.0)
Hemoglobin: 11.1 g/dL — ABNORMAL LOW (ref 12.0–15.0)
Lymphocytes Relative: 28.4 % (ref 12.0–46.0)
Lymphs Abs: 1.3 10*3/uL (ref 0.7–4.0)
MCHC: 32.9 g/dL (ref 30.0–36.0)
MCV: 85.9 fl (ref 78.0–100.0)
Monocytes Absolute: 0.6 10*3/uL (ref 0.1–1.0)
Monocytes Relative: 12.1 % — ABNORMAL HIGH (ref 3.0–12.0)
Neutro Abs: 2.6 10*3/uL (ref 1.4–7.7)
Neutrophils Relative %: 56 % (ref 43.0–77.0)
Platelets: 250 10*3/uL (ref 150.0–400.0)
RBC: 3.94 Mil/uL (ref 3.87–5.11)
RDW: 13.5 % (ref 11.5–15.5)
WBC: 4.7 10*3/uL (ref 4.0–10.5)

## 2021-05-20 LAB — LIPID PANEL
Cholesterol: 138 mg/dL (ref 0–200)
HDL: 50.4 mg/dL (ref >39.00)
LDL Cholesterol: 78 mg/dL (ref 0–99)
NonHDL: 87.28
Total CHOL/HDL Ratio: 3
Triglycerides: 45 mg/dL (ref 0.0–149.0)
VLDL: 9 mg/dL (ref 0.0–40.0)

## 2021-05-20 LAB — HEMOGLOBIN A1C: Hgb A1c MFr Bld: 6.2 % (ref 4.6–6.5)

## 2021-05-20 MED ORDER — METFORMIN HCL 500 MG PO TABS: 500.0000 mg | ORAL_TABLET | Freq: Two times a day (BID) | ORAL | 3 refills | Status: DC

## 2021-05-20 NOTE — Assessment & Plan Note (Signed)
Losing weight. ?Wt Readings from Last 3 Encounters:  ?05/20/21 227 lb 8 oz (103.2 kg)  ?01/08/21 234 lb (106.1 kg)  ?12/17/20 234 lb (106.1 kg)  ?Eating better and exercising more. ?Successful with Ozempic. ?Advised to increase Ozempic to 1 mg weekly. ?Restart metformin 500 mg twice a day ?Blood work today. ?Follow-up in 3 months. ? ?

## 2021-05-20 NOTE — Patient Instructions (Signed)
Increase Ozempic to 1 mg weekly ?Restart metformin ?Follow-up in 3 months ? ?Calorie Counting for Weight Loss ?Calories are units of energy. Your body needs a certain number of calories from food to keep going throughout the day. When you eat or drink more calories than your body needs, your body stores the extra calories mostly as fat. When you eat or drink fewer calories than your body needs, your body burns fat to get the energy it needs. ?Calorie counting means keeping track of how many calories you eat and drink each day. Calorie counting can be helpful if you need to lose weight. If you eat fewer calories than your body needs, you should lose weight. Ask your health care provider what a healthy weight is for you. ?For calorie counting to work, you will need to eat the right number of calories each day to lose a healthy amount of weight per week. A dietitian can help you figure out how many calories you need in a day and will suggest ways to reach your calorie goal. ?A healthy amount of weight to lose each week is usually 1-2 lb (0.5-0.9 kg). This usually means that your daily calorie intake should be reduced by 500-750 calories. ?Eating 1,200-1,500 calories a day can help most women lose weight. ?Eating 1,500-1,800 calories a day can help most men lose weight. ?What do I need to know about calorie counting? ?Work with your health care provider or dietitian to determine how many calories you should get each day. To meet your daily calorie goal, you will need to: ?Find out how many calories are in each food that you would like to eat. Try to do this before you eat. ?Decide how much of the food you plan to eat. ?Keep a food log. Do this by writing down what you ate and how many calories it had. ?To successfully lose weight, it is important to balance calorie counting with a healthy lifestyle that includes regular activity. ?Where do I find calorie information? ?The number of calories in a food can be found on a  Nutrition Facts label. If a food does not have a Nutrition Facts label, try to look up the calories online or ask your dietitian for help. ?Remember that calories are listed per serving. If you choose to have more than one serving of a food, you will have to multiply the calories per serving by the number of servings you plan to eat. For example, the label on a package of bread might say that a serving size is 1 slice and that there are 90 calories in a serving. If you eat 1 slice, you will have eaten 90 calories. If you eat 2 slices, you will have eaten 180 calories. ?How do I keep a food log? ?After each time that you eat, record the following in your food log as soon as possible: ?What you ate. Be sure to include toppings, sauces, and other extras on the food. ?How much you ate. This can be measured in cups, ounces, or number of items. ?How many calories were in each food and drink. ?The total number of calories in the food you ate. ?Keep your food log near you, such as in a pocket-sized notebook or on an app or website on your mobile phone. Some programs will calculate calories for you and show you how many calories you have left to meet your daily goal. ?What are some portion-control tips? ?Know how many calories are in a serving. This will  help you know how many servings you can have of a certain food. ?Use a measuring cup to measure serving sizes. You could also try weighing out portions on a kitchen scale. With time, you will be able to estimate serving sizes for some foods. ?Take time to put servings of different foods on your favorite plates or in your favorite bowls and cups so you know what a serving looks like. ?Try not to eat straight from a food's packaging, such as from a bag or box. Eating straight from the package makes it hard to see how much you are eating and can lead to overeating. Put the amount you would like to eat in a cup or on a plate to make sure you are eating the right portion. ?Use  smaller plates, glasses, and bowls for smaller portions and to prevent overeating. ?Try not to multitask. For example, avoid watching TV or using your computer while eating. If it is time to eat, sit down at a table and enjoy your food. This will help you recognize when you are full. It will also help you be more mindful of what and how much you are eating. ?What are tips for following this plan? ?Reading food labels ?Check the calorie count compared with the serving size. The serving size may be smaller than what you are used to eating. ?Check the source of the calories. Try to choose foods that are high in protein, fiber, and vitamins, and low in saturated fat, trans fat, and sodium. ?Shopping ?Read nutrition labels while you shop. This will help you make healthy decisions about which foods to buy. ?Pay attention to nutrition labels for low-fat or fat-free foods. These foods sometimes have the same number of calories or more calories than the full-fat versions. They also often have added sugar, starch, or salt to make up for flavor that was removed with the fat. ?Make a grocery list of lower-calorie foods and stick to it. ?Cooking ?Try to cook your favorite foods in a healthier way. For example, try baking instead of frying. ?Use low-fat dairy products. ?Meal planning ?Use more fruits and vegetables. One-half of your plate should be fruits and vegetables. ?Include lean proteins, such as chicken, [place], and fish. ?Lifestyle ?Each week, aim to do one of the following: ?150 minutes of moderate exercise, such as walking. ?75 minutes of vigorous exercise, such as running. ?General information ?Know how many calories are in the foods you eat most often. This will help you calculate calorie counts faster. ?Find a way of tracking calories that works for you. Get creative. Try different apps or programs if writing down calories does not work for you. ?What foods should I eat? ? ?Eat nutritious foods. It is better to have  a nutritious, high-calorie food, such as an avocado, than a food with few nutrients, such as a bag of potato chips. ?Use your calories on foods and drinks that will fill you up and will not leave you hungry soon after eating. ?Examples of foods that fill you up are nuts and nut butters, vegetables, lean proteins, and high-fiber foods such as whole grains. High-fiber foods are foods with more than 5 g of fiber per serving. ?Pay attention to calories in drinks. Low-calorie drinks include water and unsweetened drinks. ?The items listed above may not be a complete list of foods and beverages you can eat. Contact a dietitian for more information. ?What foods should I limit? ?Limit foods or drinks that are not good sources of  vitamins, minerals, or protein or that are high in unhealthy fats. These include: ?Candy. ?Other sweets. ?Sodas, specialty coffee drinks, alcohol, and juice. ?The items listed above may not be a complete list of foods and beverages you should avoid. Contact a dietitian for more information. ?How do I count calories when eating out? ?Pay attention to portions. Often, portions are much larger when eating out. Try these tips to keep portions smaller: ?Consider sharing a meal instead of getting your own. ?If you get your own meal, eat only half of it. Before you start eating, ask for a container and put half of your meal into it. ?When available, consider ordering smaller portions from the menu instead of full portions. ?Pay attention to your food and drink choices. Knowing the way food is cooked and what is included with the meal can help you eat fewer calories. ?If calories are listed on the menu, choose the lower-calorie options. ?Choose dishes that include vegetables, fruits, whole grains, low-fat dairy products, and lean proteins. ?Choose items that are boiled, broiled, grilled, or steamed. Avoid items that are buttered, battered, fried, or served with cream sauce. Items labeled as crispy are  usually fried, unless stated otherwise. ?Choose water, low-fat milk, unsweetened iced tea, or other drinks without added sugar. If you want an alcoholic beverage, choose a lower-calorie option, such as a glass

## 2021-05-20 NOTE — Progress Notes (Signed)
Misty Barnes ?51 y.o. ? ? ?Chief Complaint  ?Patient presents with  ? Follow-up  ? Medication Management  ?  Questions about Ozempic  ? ? ?HISTORY OF PRESENT ILLNESS: ?This is a 51 y.o. female here for follow-up of morbid obesity. ?Presently on Ozempic 0.5 mg weekly.  Tolerating it well. ?Presently not taking metformin. ?Colonoscopy done last November showed 1 polyp and diverticulosis.  No cancer. ?Eating better and exercising more.  Losing weight. ?No other complaints or medical concerns today. ? ?HPI ? ? ?Prior to Admission medications   ?Medication Sig Start Date End Date Taking? Authorizing Provider  ?acetaminophen (TYLENOL) 500 MG tablet Take 500 mg by mouth every 6 (six) hours as needed.     Yes [provider]  ?albuterol (PROAIR HFA) 108 (90 Base) MCG/ACT inhaler Inhale 4 puffs every 4-6 hours as needed for cough, wheeze or shortness of breath 02/25/19  Yes Raylene Everts, MD  ?desonide (DESOWEN) 0.05 % ointment APPLY TOPICALLY TO THE AFFECTED AREA UP TO TWICE DAILY AS NEEDED 03/10/20  Yes [provider]  ?doxycycline (VIBRAMYCIN) 50 MG capsule Take by mouth. 02/26/20  Yes [provider]  ?[place] 300 MG/2ML prefilled syringe  04/24/19  Yes [provider]  ?EPINEPHrine 0.3 mg/0.3 mL IJ SOAJ injection Use as directed for severe allergic reaction 02/19/18  Yes Valentina Shaggy, MD  ?metFORMIN (GLUCOPHAGE) 500 MG tablet Take 1 tablet (500 mg total) by mouth 2 (two) times daily with a meal. 11/09/20  Yes Marzelle Rutten, Ines Bloomer, MD  ?prednisoLONE acetate (PRED FORTE) 1 % ophthalmic suspension 1 drop 4 (four) times daily. 10/13/20  Yes [provider]  ?PREVIDENT 5000 BOOSTER PLUS 1.1 % PSTE Place onto teeth. 08/02/20  Yes [provider]  ?RESTASIS 0.05 % ophthalmic emulsion  12/02/20  Yes [provider]  ?Semaglutide,0.25 or 0.'5MG'$ /DOS, (OZEMPIC, 0.25 OR 0.5 MG/DOSE,) 2 MG/1.5ML SOPN Inject 0.5 mg into the skin once a week. 03/22/21  Yes  Yakima Kreitzer, Ines Bloomer, MD  ?valACYclovir (VALTREX) 500 MG tablet Take 1 tablet by mouth daily. 03/07/19  Yes [provider]  ?Azelastine HCl 0.15 % SOLN Place 2 sprays into both nostrils 2 (two) times daily. ?Patient not taking: Reported on 12/17/2020 01/12/18   [name], FNP  ?Budesonide POWD 1 application by Does not apply route 3 (three) times daily. Budesonide Cream- apply To the face ?Patient not taking: Reported on 12/17/2020    [provider]  ?fexofenadine (ALLEGRA) 180 MG tablet Take 2 tablets in the morning as needed ?Patient not taking: Reported on 01/08/2021 01/12/18   [provider]  ?hydrOXYzine (VISTARIL) 25 MG capsule Take 25-50 mg by mouth 2 (two) times daily.  ?Patient not taking: Reported on 12/17/2020    [provider]  ?ipratropium-albuterol (DUONEB) 0.5-2.5 (3) MG/3ML SOLN Take 3 mLs by nebulization. ?Patient not taking: Reported on 12/17/2020    [provider]  ?ketoconazole (NIZORAL) 2 % cream Apply to areas around nose and eyebrows daily ?Patient not taking: Reported on 01/08/2021 03/10/20   [provider]  ?Loteprednol Etabonate 0.5 % OINT Apply to eye. ?Patient not taking: Reported on 12/17/2020 09/03/18   [provider]  ?montelukast (SINGULAIR) 10 MG tablet Take 1 tablet (10 mg total) by mouth at bedtime. ?Patient not taking: Reported on 12/17/2020 12/31/15   [name], MD  ?triamcinolone (KENALOG) 0.025 % cream Apply 1 application topically 2 (two) times daily. ?Patient not taking: Reported on 01/08/2021    [provider]  ?DULoxetine (CYMBALTA) 60 MG capsule Take 60 mg by mouth daily.    05/10/11  [provider]  ? ? ?Allergies  ?Allergen Reactions  ? Fish Allergy Anaphylaxis  ? Penicillins Anaphylaxis  ? Latex   ? Percocet [Oxycodone-Acetaminophen] Nausea And Vomiting  ? Plaquenil [Hydroxychloroquine Sulfate]   ?  rash  ? Sulfa Antibiotics   ?  Unknown reaction  ? ? ?Patient Active  Problem List  ? Diagnosis Date Noted  ? History of atopic dermatitis 11/09/2020  ? Dry eyes 11/09/2020  ? Prediabetes 11/09/2020  ? Chronic urticaria 06/15/2018  ? Sjogren's syndrome with keratoconjunctivitis sicca ([place]) 03/28/2018  ? Idiopathic urticaria 01/12/2018  ? Flexural atopic dermatitis 12/31/2015  ? Moderate persistent asthma, uncomplicated 93/81/0175  ? Body mass index (BMI) of 39.0-39.9 in adult 12/15/2011  ? Hidradenitis suppurativa of right axilla 10/26/2011  ? Vitamin D deficiency 05/10/2011  ? ? ?Past Medical History:  ?Diagnosis Date  ? Allergy   ? seasonal  ? Asthma   ? Chronic kidney disease   ? stones  ? Diabetes mellitus without complication ([place])   ? prediabetic and on metformin for wt loss  ? Eczema   ? Headache(784.0)   ? migraines  ? Hypertension   ? PONV (postoperative nausea and vomiting)   ? ? ?Past Surgical History:  ?Procedure Laterality Date  ? ABDOMINAL HYSTERECTOMY    ? CESAREAN SECTION    ? x 2  ? CYSTOSCOPY  01/10/2011  ? Procedure: CYSTOSCOPY;  Surgeon: Lubertha [place] Romine;  Location: [facility];  Service: Gynecology;  Laterality: N/A;  ? ENDOMETRIAL ABLATION  2005  ? HERNIA REPAIR    ? LIPOMA EXCISION    ? cs  ? ? ?Social History  ? ?Socioeconomic History  ? Marital status: Married  ?  Spouse name: Not on file  ? Number of children: Not on file  ? Years of education: Not on file  ? Highest education level: Not on file  ?Occupational History  ? Not on file  ?Tobacco Use  ? Smoking status: Never  ? Smokeless tobacco: Never  ?Vaping Use  ? Vaping Use: Never used  ?Substance and Sexual Activity  ? Alcohol use: No  ?  Comment: occ  ? Drug use: No  ? Sexual activity: Not Currently  ?Other Topics Concern  ? Not on file  ?Social History Narrative  ? Not on file  ? ?Social Determinants of Health  ? ?Financial Resource Strain: Not on file  ?Food Insecurity: Not on file  ?Transportation Needs: Not on file  ?Physical Activity: Not on file  ?Stress: Not on file  ?Social Connections: Not on file   ?Intimate Partner Violence: Not on file  ? ? ?Family History  ?Problem Relation Age of Onset  ? Multiple sclerosis Mother   ? Hypertension Father   ? Allergies Brother   ? Healthy Brother   ? Allergic rhinitis Paternal Aunt   ? Asthma Paternal Aunt   ? Allergic rhinitis Paternal Grandmother   ? Asthma Paternal Grandmother   ? Healthy Daughter   ? Healthy Son   ? Colon cancer Neg Hx   ? Colon polyps Neg Hx   ? Esophageal cancer Neg Hx   ? Rectal cancer Neg Hx   ? Stomach cancer Neg Hx   ? ? ? ?Review of Systems  ?Constitutional: Negative.  Negative for chills and fever.  ?HENT: Negative.    ?Eyes: Negative.   ?Respiratory: Negative.    ?  Cardiovascular: Negative.   ?Gastrointestinal: Negative.   ?Genitourinary: Negative.   ?Musculoskeletal: Negative.   ?Skin: Negative.  Negative for rash.  ?All other systems reviewed and are negative. ?Today's Vitals  ? 05/20/21 1354  ?BP: 106/66  ?Pulse: 78  ?Temp: 97.9 ?F (36.6 ?C)  ?TempSrc: Oral  ?SpO2: 98%  ?Weight: 227 lb 8 oz (103.2 kg)  ?Height: '5\' 5"'$  (1.651 m)  ? ?Body mass index is 37.86 kg/m?. ? ? ?Physical Exam ?Vitals reviewed.  ?Constitutional:   ?   Appearance: Normal appearance.  ?HENT:  ?   Head: Normocephalic and atraumatic.  ?Eyes:  ?   Extraocular Movements: Extraocular movements intact.  ?   Conjunctiva/sclera: Conjunctivae normal.  ?   Pupils: Pupils are equal, round, and reactive to light.  ?Cardiovascular:  ?   Rate and Rhythm: Normal rate and regular rhythm.  ?   Pulses: Normal pulses.  ?   Heart sounds: Normal heart sounds.  ?Pulmonary:  ?   Effort: Pulmonary effort is normal.  ?   Breath sounds: Normal breath sounds.  ?Musculoskeletal:     ?   General: Normal range of motion.  ?   Cervical back: No tenderness.  ?Lymphadenopathy:  ?   Cervical: No cervical adenopathy.  ?Skin: ?   General: Skin is warm and dry.  ?   Capillary Refill: Capillary refill takes less than 2 seconds.  ?Neurological:  ?   General: No focal deficit present.  ?   Mental Status: She is  alert and oriented to person, place, and time.  ?Psychiatric:     ?   Mood and Affect: Mood normal.     ?   Behavior: Behavior normal.  ? ? ? ?ASSESSMENT & PLAN: ?Problem List Items Addressed This Visit   ? ?  ?

## 2021-05-27 ENCOUNTER — Telehealth: Payer: Self-pay | Admitting: Emergency Medicine

## 2021-05-27 ENCOUNTER — Other Ambulatory Visit: Payer: Self-pay | Admitting: Emergency Medicine

## 2021-05-27 ENCOUNTER — Encounter: Payer: Self-pay | Admitting: *Deleted

## 2021-05-27 MED ORDER — SEMAGLUTIDE (1 MG/DOSE) 4 MG/3ML ~~LOC~~ SOPN: 1.0000 mg | PEN_INJECTOR | SUBCUTANEOUS | 5 refills | Status: DC

## 2021-05-27 NOTE — Telephone Encounter (Signed)
Pt requesting a refill on Semaglutide,0.25 or 0.'5MG'$ /DOS, (OZEMPIC, 0.25 OR 0.5 MG/DOSE,) 2 MG/1.5ML SOPN ? ?Pt states a dosage increase was discussed w/ provider ? ?Pt was last seen 05-20-2021 ? ? ?

## 2021-05-27 NOTE — Telephone Encounter (Signed)
New prescription with increased dosage sent to pharmacy of record.  Thanks.

## 2021-08-10 ENCOUNTER — Encounter: Payer: Self-pay | Admitting: Emergency Medicine

## 2021-08-10 ENCOUNTER — Ambulatory Visit (INDEPENDENT_AMBULATORY_CARE_PROVIDER_SITE_OTHER): Payer: No Typology Code available for payment source | Admitting: Emergency Medicine

## 2021-08-10 VITALS — BP 100/66 | HR 80 | Temp 98.1°F | Ht 65.0 in | Wt 220.4 lb

## 2021-08-10 DIAGNOSIS — Z23 Encounter for immunization: Secondary | ICD-10-CM

## 2021-08-10 DIAGNOSIS — R7303 Prediabetes: Secondary | ICD-10-CM | POA: Diagnosis not present

## 2021-08-10 MED ORDER — ALBUTEROL SULFATE HFA 108 (90 BASE) MCG/ACT IN AERS: INHALATION_SPRAY | RESPIRATORY_TRACT | 0 refills | Status: DC

## 2021-08-10 MED ORDER — BLOOD GLUCOSE MONITOR KIT: PACK | 0 refills | Status: AC

## 2021-08-10 NOTE — Patient Instructions (Signed)

## 2021-08-10 NOTE — Assessment & Plan Note (Signed)
Doing well.  Eating better and losing weight. Continue Ozempic 1 mg weekly and metformin 500 mg twice a day. Tolerating medications well. Diet and nutrition discussed. Lab Results  Component Value Date   HGBA1C 6.2 05/20/2021

## 2021-08-10 NOTE — Progress Notes (Signed)
Misty Barnes 51 y.o.   Chief Complaint  Patient presents with   Follow-up    Patient wants a rx for a blood glucose meter     HISTORY OF PRESENT ILLNESS: This is a 51 y.o. female here for 17-monthrecheck of prediabetes and obesity. Doing well.  On Ozempic and metformin.  Tolerating medications well. Inquiring about glucometer. No other complaints or medical concerns today.  Lab Results  Component Value Date   HGBA1C 6.2 05/20/2021     HPI   Prior to Admission medications   Medication Sig Start Date End Date Taking? Authorizing Provider  albuterol (PROAIR HFA) 108 (90 Base) MCG/ACT inhaler Inhale 4 puffs every 4-6 hours as needed for cough, wheeze or shortness of breath 02/25/19  Yes NRaylene Everts MD  doxycycline (VIBRAMYCIN) 50 MG capsule Take by mouth. 02/26/20  Yes [provider]  [id number] MG/2ML prefilled syringe  04/24/19  Yes [provider]  EPINEPHrine 0.3 mg/0.3 mL IJ SOAJ injection Use as directed for severe allergic reaction 02/19/18  Yes GValentina Shaggy MD  metFORMIN (GLUCOPHAGE) 500 MG tablet Take 1 tablet (500 mg total) by mouth 2 (two) times daily with a meal. 05/20/21  Yes Mateya Torti, MInes Bloomer MD  PREVIDENT 5000 BOOSTER PLUS 1.1 % PSTE Place onto teeth. 08/02/20  Yes [provider]  RESTASIS 0.05 % ophthalmic emulsion  12/02/20  Yes [provider]  Semaglutide, 1 MG/DOSE, 4 MG/3ML SOPN Inject 1 mg as directed once a week. 05/27/21  Yes Paraskevi Funez, MInes Bloomer MD  valACYclovir (VALTREX) 500 MG tablet Take 1 tablet by mouth daily. 03/07/19  Yes [provider]  acetaminophen (TYLENOL) 500 MG tablet Take 500 mg by mouth every 6 (six) hours as needed.   Patient not taking: Reported on 08/10/2021    [provider]  Azelastine HCl 0.15 % SOLN Place 2 sprays into both nostrils 2 (two) times daily. Patient not taking: Reported on 12/17/2020 01/12/18   ADara Hoyer FNP  Budesonide POWD 1 application by  Does not apply route 3 (three) times daily. Budesonide Cream- apply To the face Patient not taking: Reported on 12/17/2020    [provider]  desonide (DESOWEN) 0.05 % ointment APPLY TOPICALLY TO THE AFFECTED AREA UP TO TWICE DAILY AS NEEDED Patient not taking: Reported on 08/10/2021 03/10/20   [provider]  fexofenadine (ALLEGRA) 180 MG tablet Take 2 tablets in the morning as needed Patient not taking: Reported on 01/08/2021 01/12/18   [provider]  hydrOXYzine (VISTARIL) 25 MG capsule Take 25-50 mg by mouth 2 (two) times daily.  Patient not taking: Reported on 12/17/2020    [provider]  ipratropium-albuterol (DUONEB) 0.5-2.5 (3) MG/3ML SOLN Take 3 mLs by nebulization. Patient not taking: Reported on 12/17/2020    [provider]  ketoconazole (NIZORAL) 2 % cream Apply to areas around nose and eyebrows daily Patient not taking: Reported on 01/08/2021 03/10/20   [provider]  Loteprednol Etabonate 0.5 % OINT Apply to eye. Patient not taking: Reported on 12/17/2020 09/03/18   [provider]  montelukast (SINGULAIR) 10 MG tablet Take 1 tablet (10 mg total) by mouth at bedtime. Patient not taking: Reported on 12/17/2020 12/31/15   PKennith Gain MD  prednisoLONE acetate (PRED FORTE) 1 % ophthalmic suspension 1 drop 4 (four) times daily. Patient not taking: Reported on 08/10/2021 10/13/20   [provider]  triamcinolone (KENALOG) 0.025 % cream Apply 1 application topically 2 (two)  times daily. Patient not taking: Reported on 01/08/2021    [provider]  DULoxetine (CYMBALTA) 60 MG capsule Take 60 mg by mouth daily.    05/10/11  [provider]    Allergies  Allergen Reactions   Fish Allergy Anaphylaxis   Penicillins Anaphylaxis   Latex    Percocet [Oxycodone-Acetaminophen] Nausea And Vomiting   Plaquenil [Hydroxychloroquine Sulfate]     rash   Sulfa Antibiotics     Unknown  reaction    Patient Active Problem List   Diagnosis Date Noted   Chronic anemia 05/20/2021   History of atopic dermatitis 11/09/2020   Dry eyes 11/09/2020   Prediabetes 11/09/2020   Chronic urticaria 06/15/2018   Sjogren's syndrome with keratoconjunctivitis sicca ([place]) 03/28/2018   Idiopathic urticaria 01/12/2018   Flexural atopic dermatitis 12/31/2015   Moderate persistent asthma, uncomplicated 25/36/6440   Body mass index (BMI) of 39.0-39.9 in adult 12/15/2011   Hidradenitis suppurativa of right axilla 10/26/2011   Vitamin D deficiency 05/10/2011    Past Medical History:  Diagnosis Date   Allergy    seasonal   Asthma    Chronic kidney disease    stones   Diabetes mellitus without complication ([place])    prediabetic and on metformin for wt loss   Eczema    Headache(784.0)    migraines   Hypertension    PONV (postoperative nausea and vomiting)     Past Surgical History:  Procedure Laterality Date   ABDOMINAL HYSTERECTOMY     CESAREAN SECTION     x 2   CYSTOSCOPY  01/10/2011   Procedure: CYSTOSCOPY;  Surgeon: Lubertha South Romine;  Location: [facility];  Service: Gynecology;  Laterality: N/A;   ENDOMETRIAL ABLATION  2005   HERNIA REPAIR     LIPOMA EXCISION     cs    Social History   Socioeconomic History   Marital status: Married    Spouse name: Not on file   Number of children: Not on file   Years of education: Not on file   Highest education level: Not on file  Occupational History   Not on file  Tobacco Use   Smoking status: Never   Smokeless tobacco: Never  Vaping Use   Vaping Use: Never used  Substance and Sexual Activity   Alcohol use: No    Comment: occ   Drug use: No   Sexual activity: Not Currently  Other Topics Concern   Not on file  Social History Narrative   Not on file   Social Determinants of Health   Financial Resource Strain: Not on file  Food Insecurity: Not on file  Transportation Needs: Not on file  Physical Activity: Not on file   Stress: Not on file  Social Connections: Not on file  Intimate Partner Violence: Not on file    Family History  Problem Relation Age of Onset   Multiple sclerosis Mother    Hypertension Father    Allergies Brother    Healthy Brother    Allergic rhinitis Paternal Aunt    Asthma Paternal Aunt    Allergic rhinitis Paternal Grandmother    Asthma Paternal Grandmother    Healthy Daughter    Healthy Son    Colon cancer Neg Hx    Colon polyps Neg Hx    Esophageal cancer Neg Hx    Rectal cancer Neg Hx    Stomach cancer Neg Hx      Review of Systems  Constitutional: Negative.  Negative for chills  and fever.  HENT: Negative.    Cardiovascular: Negative.  Negative for chest pain and palpitations.  Skin: Negative.  Negative for rash.  All other systems reviewed and are negative.   Today's Vitals   08/10/21 0926  BP: 100/66  Pulse: 80  Temp: 98.1 F (36.7 C)  TempSrc: Oral  SpO2: 96%  Weight: 220 lb 6 oz (100 kg)  Height: $Remove'5\' 5"'[id number]$  (1.651 m)   Body mass index is 36.67 kg/m. Wt Readings from Last 3 Encounters:  08/10/21 220 lb 6 oz (100 kg)  05/20/21 227 lb 8 oz (103.2 kg)  01/08/21 234 lb (106.1 kg)    Physical Exam Vitals reviewed.  Constitutional:      Appearance: Normal appearance.  HENT:     Head: Normocephalic.  Eyes:     Extraocular Movements: Extraocular movements intact.     Conjunctiva/sclera: Conjunctivae normal.     Pupils: Pupils are equal, round, and reactive to light.  Cardiovascular:     Rate and Rhythm: Normal rate and regular rhythm.     Pulses: Normal pulses.     Heart sounds: Normal heart sounds.  Pulmonary:     Effort: Pulmonary effort is normal.     Breath sounds: Normal breath sounds.  Musculoskeletal:     Cervical back: No tenderness.     Right lower leg: No edema.     Left lower leg: No edema.  Lymphadenopathy:     Cervical: No cervical adenopathy.  Skin:    General: Skin is warm and dry.     Capillary Refill: Capillary refill takes  less than 2 seconds.  Neurological:     General: No focal deficit present.     Mental Status: She is alert and oriented to person, place, and time.  Psychiatric:        Mood and Affect: Mood normal.        Behavior: Behavior normal.      ASSESSMENT & PLAN: A total of 32 minutes was spent with the patient and counseling/coordination of care regarding preparing for this visit, review of most recent office visit notes, review of most recent blood work results, review of all medications, education on nutrition, cardiovascular risks associated with diabetes, prognosis, documentation, and need for follow-up.  Problem List Items Addressed This Visit       Other   Prediabetes - Primary    Doing well.  Eating better and losing weight. Continue Ozempic 1 mg weekly and metformin 500 mg twice a day. Tolerating medications well. Diet and nutrition discussed. Lab Results  Component Value Date   HGBA1C 6.2 05/20/2021         Relevant Medications   blood glucose meter kit and supplies KIT   Patient Instructions  Prediabetes Prediabetes is when your blood sugar (blood glucose) level is higher than normal but not high enough for you to be diagnosed with type 2 diabetes. Having prediabetes puts you at risk for developing type 2 diabetes (type 2 diabetes mellitus). With certain lifestyle changes, you may be able to prevent or delay the onset of type 2 diabetes. This is important because type 2 diabetes can lead to serious complications, such as: Heart disease. Stroke. Blindness. Kidney disease. Depression. Poor circulation in the feet and legs. In severe cases, this could lead to surgical removal of a leg (amputation). What are the causes? The exact cause of prediabetes is not known. It may result from insulin resistance. Insulin resistance develops when cells in the body do not  respond properly to insulin that the body makes. This can cause excess glucose to build up in the blood. High blood  glucose (hyperglycemia) can develop. What increases the risk? The following factors may make you more likely to develop this condition: You have a family member with type 2 diabetes. You are older than 45 years. You had a temporary form of diabetes during a pregnancy (gestational diabetes). You had polycystic ovary syndrome (PCOS). You are overweight or obese. You are inactive (sedentary). You have a history of heart disease, including problems with cholesterol levels, high levels of blood fats, or high blood pressure. What are the signs or symptoms? You may have no symptoms. If you do have symptoms, they may include: Increased hunger. Increased thirst. Increased urination. Vision changes, such as blurry vision. Tiredness (fatigue). How is this diagnosed? This condition can be diagnosed with blood tests. Your blood glucose may be checked with one or more of the following tests: A fasting blood glucose (FBG) test. You will not be allowed to eat (you will fast) for at least 8 hours before a blood sample is taken. An A1C blood test (hemoglobin A1C). This test provides information about blood glucose levels over the previous 2?3 months. An oral glucose tolerance test (OGTT). This test measures your blood glucose at two points in time: After fasting. This is your baseline level. Two hours after you drink a beverage that contains glucose. You may be diagnosed with prediabetes if: Your FBG is 100?125 mg/dL (5.6-6.9 mmol/L). Your A1C level is 5.7?6.4% (39-46 mmol/mol). Your OGTT result is 140?199 mg/dL (7.8-11 mmol/L). These blood tests may be repeated to confirm your diagnosis. How is this treated? Treatment may include dietary and lifestyle changes to help lower your blood glucose and prevent type 2 diabetes from developing. In some cases, medicine may be prescribed to help lower the risk of type 2 diabetes. Follow these instructions at home: Nutrition  Follow a healthy meal plan. This  includes eating lean proteins, whole grains, legumes, fresh fruits and vegetables, low-fat dairy products, and healthy fats. Follow instructions from your health care provider about eating or drinking restrictions. Meet with a dietitian to create a healthy eating plan that is right for you. Lifestyle Do moderate-intensity exercise for at least 30 minutes a day on 5 or more days each week, or as told by your health care provider. A mix of activities may be best, such as: Brisk walking, swimming, biking, and weight lifting. Lose weight as told by your health care provider. Losing 5-7% of your body weight can reverse insulin resistance. Do not drink alcohol if: Your health care provider tells you not to drink. You are pregnant, may be pregnant, or are planning to become pregnant. If you drink alcohol: Limit how much you use to: 0-1 drink a day for women. 0-2 drinks a day for men. Be aware of how much alcohol is in your drink. In the [place] one drink equals one 12 oz bottle of beer (355 mL), one 5 oz glass of wine (148 mL), or one 1 oz glass of hard liquor (44 mL). General instructions Take over-the-counter and prescription medicines only as told by your health care provider. You may be prescribed medicines that help lower the risk of type 2 diabetes. Do not use any products that contain nicotine or tobacco, such as cigarettes, e-cigarettes, and chewing tobacco. If you need help quitting, ask your health care provider. Keep all follow-up visits. This is important. Where to find more information  American Diabetes Association: www.diabetes.org Academy of Nutrition and Dietetics: www.eatright.org American Heart Association: www.heart.org Contact a health care provider if: You have any of these symptoms: Increased hunger. Increased urination. Increased thirst. Fatigue. Vision changes, such as blurry vision. Get help right away if you: Have shortness of breath. Feel confused. Vomit or feel  like you may vomit. Summary Prediabetes is when your blood sugar (blood glucose)level is higher than normal but not high enough for you to be diagnosed with type 2 diabetes. Having prediabetes puts you at risk for developing type 2 diabetes (type 2 diabetes mellitus). Make lifestyle changes such as eating a healthy diet and exercising regularly to help prevent diabetes. Lose weight as told by your health care provider. This information is not intended to replace advice given to you by your health care provider. Make sure you discuss any questions you have with your health care provider. Document Revised: 05/09/2019 Document Reviewed: 05/09/2019 Elsevier Patient Education  [place], MD [facility]

## 2021-11-11 ENCOUNTER — Other Ambulatory Visit: Payer: Self-pay | Admitting: Emergency Medicine

## 2021-11-17 ENCOUNTER — Ambulatory Visit (INDEPENDENT_AMBULATORY_CARE_PROVIDER_SITE_OTHER): Payer: No Typology Code available for payment source

## 2021-11-17 DIAGNOSIS — Z23 Encounter for immunization: Secondary | ICD-10-CM

## 2021-11-17 NOTE — Progress Notes (Signed)
After obtaining consent, and per orders of Dr. Mitchel Honour, injection of Shingrix given in the right deltoid by Marrian Salvage. Patient tolerated well and instructed to report any adverse reaction to me immediately.

## 2022-02-10 ENCOUNTER — Ambulatory Visit: Payer: No Typology Code available for payment source | Admitting: Emergency Medicine

## 2022-02-17 ENCOUNTER — Encounter: Payer: Self-pay | Admitting: Emergency Medicine

## 2022-02-17 ENCOUNTER — Ambulatory Visit (INDEPENDENT_AMBULATORY_CARE_PROVIDER_SITE_OTHER): Payer: No Typology Code available for payment source | Admitting: Emergency Medicine

## 2022-02-17 VITALS — BP 128/78 | HR 111 | Temp 100.3°F | Ht 65.0 in | Wt 207.4 lb

## 2022-02-17 DIAGNOSIS — D649 Anemia, unspecified: Secondary | ICD-10-CM

## 2022-02-17 DIAGNOSIS — Z6839 Body mass index (BMI) 39.0-39.9, adult: Secondary | ICD-10-CM | POA: Diagnosis not present

## 2022-02-17 DIAGNOSIS — R7303 Prediabetes: Secondary | ICD-10-CM | POA: Diagnosis not present

## 2022-02-17 DIAGNOSIS — R6889 Other general symptoms and signs: Secondary | ICD-10-CM | POA: Diagnosis not present

## 2022-02-17 DIAGNOSIS — M3501 Sicca syndrome with keratoconjunctivitis: Secondary | ICD-10-CM | POA: Diagnosis not present

## 2022-02-17 DIAGNOSIS — B9789 Other viral agents as the cause of diseases classified elsewhere: Secondary | ICD-10-CM | POA: Insufficient documentation

## 2022-02-17 DIAGNOSIS — J4540 Moderate persistent asthma, uncomplicated: Secondary | ICD-10-CM | POA: Diagnosis not present

## 2022-02-17 DIAGNOSIS — J988 Other specified respiratory disorders: Secondary | ICD-10-CM

## 2022-02-17 LAB — POCT INFLUENZA A/B
Influenza A, POC: NEGATIVE
Influenza B, POC: NEGATIVE

## 2022-02-17 LAB — POC COVID19 BINAXNOW: SARS Coronavirus 2 Ag: NEGATIVE

## 2022-02-17 MED ORDER — PSEUDOEPHEDRINE-GUAIFENESIN ER 60-600 MG PO TB12: 1.0000 | ORAL_TABLET | Freq: Two times a day (BID) | ORAL | 1 refills | Status: AC

## 2022-02-17 MED ORDER — ALBUTEROL SULFATE HFA 108 (90 BASE) MCG/ACT IN AERS: INHALATION_SPRAY | RESPIRATORY_TRACT | 0 refills | Status: DC

## 2022-02-17 MED ORDER — HYDROCODONE-ACETAMINOPHEN 5-325 MG PO TABS: 1.0000 | ORAL_TABLET | Freq: Four times a day (QID) | ORAL | 0 refills | Status: DC | PRN

## 2022-02-17 NOTE — Progress Notes (Signed)
Misty Barnes 51 y.o.   Chief Complaint  Patient presents with   Follow-up    77mth f/u appt, ear pain, sore throat, headaches, body aches, fever x 2 days    HISTORY OF PRESENT ILLNESS: This is a 51y.o. female complaining of flulike symptoms that started about 3 days ago with sore throat, ear pain, headaches, body aches and fever Also here for 683-monthollow-up of prediabetes.  Eating better and losing weight.  Tolerating Ozempic well. No other complaints or medical concerns today.  HPI   Prior to Admission medications   Medication Sig Start Date End Date Taking? Authorizing Provider  albuterol (PROAIR HFA) 108 (90 Base) MCG/ACT inhaler Inhale 4 puffs every 4-6 hours as needed for cough, wheeze or shortness of breath 08/10/21  Yes Giara Mcgaughey, MiInes BloomerMD  blood glucose meter kit and supplies KIT Dispense based on patient and insurance preference. Use up to four times daily as directed. 08/10/21  Yes Zhuri Krass, MiInes BloomerMD  doxycycline (VIBRAMYCIN) 50 MG capsule Take by mouth. 02/26/20  Yes [provider]  DULake [place]00 MG/2ML prefilled syringe  04/24/19  Yes [provider]  EPINEPHrine 0.3 mg/0.3 mL IJ SOAJ injection Use as directed for severe allergic reaction 02/19/18  Yes GaValentina ShaggyMD  metFORMIN (GLUCOPHAGE) 500 MG tablet Take 1 tablet (500 mg total) by mouth 2 (two) times daily with a meal. 05/20/21  Yes Andrus Sharp, MiInes BloomerMD  OZEMPIC, 1 MG/DOSE, 4 MG/3ML SOPN INJECT 1 MG UNDER THE SKIN ONCE A WEEK AS DIRECTED 11/11/21  Yes Janiene Aarons, MiInes BloomerMD  PREVIDENT 5000 BOOSTER PLUS 1.1 % PSTE Place onto teeth. 08/02/20  Yes [provider]  RESTASIS 0.05 % ophthalmic emulsion  12/02/20  Yes [provider]  valACYclovir (VALTREX) 500 MG tablet Take 1 tablet by mouth daily. 03/07/19  Yes [provider]  DULoxetine (CYMBALTA) 60 MG capsule Take 60 mg by mouth daily.    05/10/11  [provider]    Allergies   Allergen Reactions   Fish Allergy Anaphylaxis   Penicillins Anaphylaxis   Latex    Percocet [Oxycodone-Acetaminophen] Nausea And Vomiting   Plaquenil [Hydroxychloroquine Sulfate]     rash   Sulfa Antibiotics     Unknown reaction    Patient Active Problem List   Diagnosis Date Noted   Chronic anemia 05/20/2021   History of atopic dermatitis 11/09/2020   Dry eyes 11/09/2020   Prediabetes 11/09/2020   Chronic urticaria 06/15/2018   Sjogren's syndrome with keratoconjunctivitis sicca ([facility]02/06/2018   Moderate persistent asthma, uncomplicated 1176/19/5093 Body mass index (BMI) of 39.0-39.9 in adult 12/15/2011   Vitamin D deficiency 05/10/2011    Past Medical History:  Diagnosis Date   Allergy    seasonal   Asthma    Chronic kidney disease    stones   Diabetes mellitus without complication ([facility]   prediabetic and on metformin for wt loss   Eczema    Headache(784.0)    migraines   Hypertension    PONV (postoperative nausea and vomiting)     Past Surgical History:  Procedure Laterality Date   ABDOMINAL HYSTERECTOMY     CESAREAN SECTION     x 2   CYSTOSCOPY  01/10/2011   Procedure: CYSTOSCOPY;  Surgeon: CyLubertha Southomine;  [facility];  Service: Gynecology;  Laterality: N/A;   ENDOMETRIAL ABLATION  2005   HERNIA REPAIR     LIPOMA EXCISION     cs  Social History   Socioeconomic History   Marital status: Married    Spouse name: Not on file   Number of children: Not on file   Years of education: Not on file   Highest education level: Not on file  Occupational History   Not on file  Tobacco Use   Smoking status: Never   Smokeless tobacco: Never  Vaping Use   Vaping Use: Never used  Substance and Sexual Activity   Alcohol use: No    Comment: occ   Drug use: No   Sexual activity: Not Currently  Other Topics Concern   Not on file  Social History Narrative   Not on file   Social Determinants of Health   Financial Resource Strain: Not on file   Food Insecurity: Not on file  Transportation Needs: Not on file  Physical Activity: Not on file  Stress: Not on file  Social Connections: Not on file  Intimate Partner Violence: Not on file    Family History  Problem Relation Age of Onset   Multiple sclerosis Mother    Hypertension Father    Allergies Brother    Healthy Brother    Allergic rhinitis Paternal Aunt    Asthma Paternal Aunt    Allergic rhinitis Paternal Grandmother    Asthma Paternal Grandmother    Healthy Daughter    Healthy Son    Colon cancer Neg Hx    Colon polyps Neg Hx    Esophageal cancer Neg Hx    Rectal cancer Neg Hx    Stomach cancer Neg Hx      Review of Systems  Constitutional:  Positive for fever.  HENT:  Positive for congestion, ear pain and sore throat.   Respiratory:  Positive for cough. Negative for shortness of breath.   Cardiovascular:  Negative for chest pain and palpitations.  Gastrointestinal:  Negative for abdominal pain, diarrhea, nausea and vomiting.  Neurological:  Positive for headaches. Negative for dizziness.    Today's Vitals   02/17/22 1351  BP: 128/78  Pulse: (!) 111  Temp: 100.3 F (37.9 C)  TempSrc: Oral  SpO2: 96%  Weight: 207 lb 6 oz (94.1 kg)  Height: _0  (1.651 m)   Body mass index is 34.51 kg/m. Wt Readings from Last 3 Encounters:  02/17/22 207 lb 6 oz (94.1 kg)  08/10/21 220 lb 6 oz (100 kg)  05/20/21 227 lb 8 oz (103.2 kg)    Physical Exam Vitals reviewed.  Constitutional:      Appearance: Normal appearance.  HENT:     Head: Normocephalic.     Mouth/Throat:     Mouth: Mucous membranes are moist.     Pharynx: Oropharynx is clear.  Eyes:     Extraocular Movements: Extraocular movements intact.     Pupils: Pupils are equal, round, and reactive to light.  Cardiovascular:     Rate and Rhythm: Normal rate and regular rhythm.     Pulses: Normal pulses.     Heart sounds: Normal heart sounds.  Pulmonary:     Effort: Pulmonary effort is normal.      Breath sounds: Normal breath sounds.  Musculoskeletal:     Cervical back: No tenderness.  Lymphadenopathy:     Cervical: No cervical adenopathy.  Skin:    General: Skin is warm and dry.  Neurological:     General: No focal deficit present.     Mental Status: She is alert and oriented to person, place, and time.  Psychiatric:  Mood and Affect: Mood normal.        Behavior: Behavior normal.    Results for orders placed or performed in visit on 02/17/22 (from the past 24 hour(s))  POC COVID-19     Status: Normal   Collection Time: 02/17/22  2:17 PM  Result Value Ref Range   SARS Coronavirus 2 Ag Negative Negative  POCT Influenza A/B     Status: Normal   Collection Time: 02/17/22  2:17 PM  Result Value Ref Range   Influenza A, POC Negative Negative   Influenza B, POC Negative Negative     ASSESSMENT & PLAN: A total of 42 minutes was spent with the patient and counseling/coordination of care regarding preparing for this visit, review of most recent office visit notes, review of most recent blood work results, review of multiple chronic medical conditions under management, review of all medications, diagnosis of viral respiratory infection and management, pain management, prognosis, documentation, and need for follow-up  Problem List Items Addressed This Visit       Respiratory   Moderate persistent asthma, uncomplicated    Stable.  Using albuterol as rescue inhaler.      Relevant Medications   albuterol (PROAIR HFA) 108 (90 Base) MCG/ACT inhaler   Viral respiratory infection    Running its course without complications. Advised to rest and stay well-hydrated. Mucinex D for congestion as needed along with Norco for headaches and achiness as needed        Other   Body mass index (BMI) of 39.0-39.9 in adult    Continues to lose weight.  Eating better. Responded to Ozempic 1 mg weekly.  No side effects. Diet and nutrition discussed.      Sjogren's syndrome with  keratoconjunctivitis sicca (HCC)    Stable.  Responding well to medications. Follows up with rheumatologist.      Prediabetes - Primary    Last hemoglobin A1c of 5.6 per patient. Eating better and losing weight Continue metformin 500 mg twice a day along with Ozempic 1 mg weekly      Chronic anemia    Stable and asymptomatic.      Flu-like symptoms    Advised to rest and stay well-hydrated. Take Mucinex D and Norco as needed      Relevant Medications   HYDROcodone-acetaminophen (NORCO) 5-325 MG tablet   pseudoephedrine-guaifenesin (MUCINEX D) 60-600 MG 12 hr tablet   Other Relevant Orders   POC COVID-19 (Completed)   POCT Influenza A/B (Completed)   Patient Instructions  Viral Respiratory Infection A viral respiratory infection is an illness that affects parts of the body that are used for breathing. These include the lungs, nose, and throat. It is caused by a germ called a virus. Some examples of this kind of infection are: A cold. The flu (influenza). A respiratory syncytial virus (RSV) infection. What are the causes? This condition is caused by a virus. It spreads from person to person. You can get the virus if: You breathe in droplets from someone who is sick. You come in contact with people who are sick. You touch mucus or other fluid from a person who is sick. What are the signs or symptoms? Symptoms of this condition include: A stuffy or runny nose. A sore throat. A cough. Shortness of breath. Trouble breathing. Yellow or green fluid in the nose. Other symptoms may include: A fever. Sweating or chills. Tiredness (fatigue). Achy muscles. A headache. How is this treated? This condition may be treated with: Medicines  that treat viruses. Medicines that make it easy to breathe. Medicines that are sprayed into the nose. Acetaminophen or NSAIDs, such as ibuprofen, to treat fever. Follow these instructions at home: Managing pain and congestion Take  over-the-counter and prescription medicines only as told by your doctor. If you have a sore throat, gargle with salt water. Do this 3-4 times a day or as needed. To make salt water, dissolve -1 tsp (3-6 g) of salt in 1 cup (237 mL) of warm water. Make sure that all the salt dissolves. Use nose drops made from salt water. This helps with stuffiness (congestion). It also helps soften the skin around your nose. Take 2 tsp (10 mL) of honey at bedtime to lessen coughing at night. Do not give honey to children who are younger than 14 year old. Drink enough fluid to keep your pee (urine) pale yellow. General instructions  Rest as much as possible. Do not drink alcohol. Do not smoke or use any products that contain nicotine or tobacco. If you need help quitting, ask your doctor. Keep all follow-up visits. How is this prevented?     Get a flu shot every year. Ask your doctor when you should get your flu shot. Do not let other people get your germs. If you are sick: Wash your hands with soap and water often. Wash your hands after you cough or sneeze. Wash hands for at least 20 seconds. If you cannot use soap and water, use hand sanitizer. Cover your mouth when you cough. Cover your nose and mouth when you sneeze. Do not share cups or eating utensils. Clean commonly used objects often. Clean commonly touched surfaces. Stay home from work or school. Avoid contact with people who are sick during cold and flu season. This is in fall and winter. Get help if: Your symptoms last for 10 days or longer. Your symptoms get worse over time. You have very bad pain in your face or forehead. Parts of your jaw or neck get very swollen. You have shortness of breath. Get help right away if: You feel pain or pressure in your chest. You have trouble breathing. You faint or feel like you will faint. You keep vomiting and it gets worse. You feel confused. These symptoms may be an emergency. Get help right away.  Call your local emergency services (911 in the [place] Do not wait to see if the symptoms will go away. Do not drive yourself to the hospital. Summary A viral respiratory infection is an illness that affects parts of the body that are used for breathing. Examples of this illness include a cold, the flu, and a respiratory syncytial virus (RSV) infection. The infection can cause a runny nose, cough, sore throat, and fever. Follow what your doctor tells you about taking medicines, drinking lots of fluid, washing your hands, resting at home, and avoiding people who are sick. This information is not intended to replace advice given to you by your health care provider. Make sure you discuss any questions you have with your health care provider. Document Revised: 05/14/2020 Document Reviewed: 05/14/2020 Elsevier Patient Education  [place], MD [facility]

## 2022-02-17 NOTE — Assessment & Plan Note (Signed)
Stable.  Using albuterol as rescue inhaler.

## 2022-02-17 NOTE — Assessment & Plan Note (Signed)
Stable.  Responding well to medications. Follows up with rheumatologist.

## 2022-02-17 NOTE — Assessment & Plan Note (Signed)
Continues to lose weight.  Eating better. Responded to Ozempic 1 mg weekly.  No side effects. Diet and nutrition discussed.

## 2022-02-17 NOTE — Patient Instructions (Signed)

## 2022-02-17 NOTE — Assessment & Plan Note (Signed)
Stable and asymptomatic  

## 2022-02-17 NOTE — Assessment & Plan Note (Signed)
Running its course without complications. Advised to rest and stay well-hydrated. Mucinex D for congestion as needed along with Norco for headaches and achiness as needed

## 2022-02-17 NOTE — Assessment & Plan Note (Signed)
Last hemoglobin A1c of 5.6 per patient. Eating better and losing weight Continue metformin 500 mg twice a day along with Ozempic 1 mg weekly

## 2022-02-17 NOTE — Assessment & Plan Note (Signed)
Advised to rest and stay well-hydrated. Take Mucinex D and Norco as needed

## 2022-05-28 ENCOUNTER — Other Ambulatory Visit: Payer: Self-pay | Admitting: Emergency Medicine

## 2022-06-02 ENCOUNTER — Telehealth: Payer: Self-pay | Admitting: *Deleted

## 2022-06-02 ENCOUNTER — Telehealth: Payer: Self-pay | Admitting: Emergency Medicine

## 2022-06-02 NOTE — Telephone Encounter (Signed)
Called patient and informed her that the PA for the Ozempic was submitted. Also informed patient that it may not be approved due to patient not having a Type 2 diabetic diagnoses. Patient understood

## 2022-06-02 NOTE — Telephone Encounter (Signed)
Patient called states she tried to pick up her Ozempic but was told she needed the office to send in an authorization for it. If there are any questions patient can be reached at [phone].

## 2022-06-02 NOTE — Telephone Encounter (Signed)
PA for ozempic submitted, awaiting response Key: [id number]

## 2022-06-29 NOTE — Telephone Encounter (Signed)
Called patient and informed her of the alternative weight loss medication. Wegovy, Saxenda, Zepbound. Patient will call insurance company to see which ones are covered. She will call the office back

## 2022-06-29 NOTE — Telephone Encounter (Signed)
Pt has called for an update on her PA. I was able to look into the PA and seen it was denied. Pt has asked that the nurse give her a call back with providers advice on an alternative med.

## 2022-06-30 NOTE — Telephone Encounter (Signed)
Called patient and was unable to leave message due to VM being full, if patient calls back please ask which weight loss medication she wants Dr. Alvy Bimler to prescribe. She does have a prediabetic diagnosis in EPIC we can try to see if that will approve the PA.

## 2022-06-30 NOTE — Telephone Encounter (Signed)
Patient called back and said that her insurance would not cover any of the 3 other medications unless it  states that the medication is needed because patient is pre diabetic, not for weight loss.

## 2022-07-30 ENCOUNTER — Other Ambulatory Visit: Payer: Self-pay | Admitting: Emergency Medicine

## 2022-07-30 DIAGNOSIS — R7303 Prediabetes: Secondary | ICD-10-CM

## 2022-08-16 ENCOUNTER — Ambulatory Visit: Payer: No Typology Code available for payment source | Admitting: Emergency Medicine

## 2022-09-20 ENCOUNTER — Ambulatory Visit: Payer: No Typology Code available for payment source | Admitting: Emergency Medicine

## 2022-09-22 ENCOUNTER — Encounter: Payer: Self-pay | Admitting: Emergency Medicine

## 2022-09-22 ENCOUNTER — Ambulatory Visit: Payer: BC Managed Care – PPO | Admitting: Emergency Medicine

## 2022-09-22 VITALS — BP 128/74 | HR 67 | Temp 98.3°F | Ht 65.0 in | Wt 196.2 lb

## 2022-09-22 DIAGNOSIS — D649 Anemia, unspecified: Secondary | ICD-10-CM

## 2022-09-22 DIAGNOSIS — R7303 Prediabetes: Secondary | ICD-10-CM

## 2022-09-22 DIAGNOSIS — J4540 Moderate persistent asthma, uncomplicated: Secondary | ICD-10-CM

## 2022-09-22 DIAGNOSIS — M3501 Sicca syndrome with keratoconjunctivitis: Secondary | ICD-10-CM

## 2022-09-22 LAB — COMPREHENSIVE METABOLIC PANEL
ALT: 30 U/L (ref 0–35)
AST: 27 U/L (ref 0–37)
Albumin: 3.8 g/dL (ref 3.5–5.2)
Alkaline Phosphatase: 66 U/L (ref 39–117)
BUN: 21 mg/dL (ref 6–23)
CO2: 27 mEq/L (ref 19–32)
Calcium: 9.7 mg/dL (ref 8.4–10.5)
Chloride: 105 mEq/L (ref 96–112)
Creatinine, Ser: 0.82 mg/dL (ref 0.40–1.20)
GFR: 82.76 mL/min (ref >60.00)
Glucose, Bld: 82 mg/dL (ref 70–99)
Potassium: 3.9 mEq/L (ref 3.5–5.1)
Sodium: 135 mEq/L (ref 135–145)
Total Bilirubin: 0.2 mg/dL (ref 0.2–1.2)
Total Protein: 9.3 g/dL — ABNORMAL HIGH (ref 6.0–8.3)

## 2022-09-22 LAB — HEMOGLOBIN A1C: Hgb A1c MFr Bld: 5.9 % (ref 4.6–6.5)

## 2022-09-22 LAB — CBC WITH DIFFERENTIAL/PLATELET
Basophils Absolute: 0 10*3/uL (ref 0.0–0.1)
Basophils Relative: 0.5 % (ref 0.0–3.0)
Eosinophils Absolute: 0.1 10*3/uL (ref 0.0–0.7)
Eosinophils Relative: 2.9 % (ref 0.0–5.0)
HCT: 33.4 % — ABNORMAL LOW (ref 36.0–46.0)
Hemoglobin: 10.8 g/dL — ABNORMAL LOW (ref 12.0–15.0)
Lymphocytes Relative: 37.4 % (ref 12.0–46.0)
Lymphs Abs: 1.5 10*3/uL (ref 0.7–4.0)
MCHC: 32.2 g/dL (ref 30.0–36.0)
MCV: 87.9 fl (ref 78.0–100.0)
Monocytes Absolute: 0.4 10*3/uL (ref 0.1–1.0)
Monocytes Relative: 10.8 % (ref 3.0–12.0)
Neutro Abs: 1.9 10*3/uL (ref 1.4–7.7)
Neutrophils Relative %: 48.4 % (ref 43.0–77.0)
Platelets: 240 10*3/uL (ref 150.0–400.0)
RBC: 3.81 Mil/uL — ABNORMAL LOW (ref 3.87–5.11)
RDW: 13.9 % (ref 11.5–15.5)
WBC: 3.9 10*3/uL — ABNORMAL LOW (ref 4.0–10.5)

## 2022-09-22 LAB — LIPID PANEL
Cholesterol: 148 mg/dL (ref 0–200)
HDL: 55.2 mg/dL (ref >39.00)
LDL Cholesterol: 83 mg/dL (ref 0–99)
NonHDL: 92.41
Total CHOL/HDL Ratio: 3
Triglycerides: 46 mg/dL (ref 0.0–149.0)
VLDL: 9.2 mg/dL (ref 0.0–40.0)

## 2022-09-22 NOTE — Assessment & Plan Note (Signed)
Stable and well-controlled.  No concerns. 

## 2022-09-22 NOTE — Assessment & Plan Note (Signed)
Eating better and losing weight. On Wegovy 1 mg weekly Continues metformin 500 mg twice a day Cardiovascular risk associated with diabetes discussed Diet and nutrition discussed Blood work done today including hemoglobin A1c

## 2022-09-22 NOTE — Patient Instructions (Signed)

## 2022-09-22 NOTE — Progress Notes (Signed)
Misty Barnes 52 y.o.   Chief Complaint  Patient presents with   Medical Management of Chronic Issues    f/u appt,     HISTORY OF PRESENT ILLNESS: This is a 52 y.o. female here for 88-month follow-up of chronic medical conditions Overall doing much better.  Eating better and exercising more Has been able to lose weight.  On Wegovy 1 mg weekly. Wt Readings from Last 3 Encounters:  09/22/22 196 lb 4 oz (89 kg)  02/17/22 207 lb 6 oz (94.1 kg)  08/10/21 220 lb 6 oz (100 kg)     HPI   Prior to Admission medications   Medication Sig Start Date End Date Taking? Authorizing Provider  albuterol (PROAIR HFA) 108 (90 Base) MCG/ACT inhaler Inhale 4 puffs every 4-6 hours as needed for cough, wheeze or shortness of breath 02/17/22  Yes Holly Pring, Eilleen Kempf, MD  blood glucose meter kit and supplies KIT Dispense based on patient and insurance preference. Use up to four times daily as directed. 08/10/21  Yes Georgina Quint, MD  DUPIXENT 300 MG/2ML prefilled syringe  04/24/19  Yes [provider]  EPINEPHrine 0.3 mg/0.3 mL IJ SOAJ injection Use as directed for severe allergic reaction 02/19/18  Yes Alfonse Spruce, MD  metFORMIN (GLUCOPHAGE) 500 MG tablet TAKE 1 TABLET(500 MG) BY MOUTH TWICE DAILY WITH A MEAL 07/30/22  Yes Ahman Dugdale, Eilleen Kempf, MD  OZEMPIC, 1 MG/DOSE, 4 MG/3ML SOPN INJECT 1 MG UNDER THE SKIN ONE DAY A WEEK AS DIRECTED 05/28/22  Yes Miraya Cudney, Eilleen Kempf, MD  PREVIDENT 5000 BOOSTER PLUS 1.1 % PSTE Place onto teeth. 08/02/20  Yes [provider]  RESTASIS 0.05 % ophthalmic emulsion  12/02/20  Yes [provider]  valACYclovir (VALTREX) 500 MG tablet Take 1 tablet by mouth daily. 03/07/19  Yes [provider]  doxycycline (VIBRAMYCIN) 50 MG capsule Take by mouth. Patient not taking: Reported on 09/22/2022 02/26/20   [provider]  DULoxetine (CYMBALTA) 60 MG capsule Take 60 mg by mouth daily.    05/10/11  [provider]     Allergies  Allergen Reactions   Fish Allergy Anaphylaxis   Penicillins Anaphylaxis   Latex    Percocet [Oxycodone-Acetaminophen] Nausea And Vomiting   Plaquenil [Hydroxychloroquine Sulfate]     rash   Sulfa Antibiotics     Unknown reaction    Patient Active Problem List   Diagnosis Date Noted   Chronic anemia 05/20/2021   History of atopic dermatitis 11/09/2020   Dry eyes 11/09/2020   Prediabetes 11/09/2020   Chronic urticaria 06/15/2018   Sjogren's syndrome with keratoconjunctivitis sicca (HCC) 03/28/2018   Moderate persistent asthma, uncomplicated 12/31/2015   Body mass index (BMI) of 39.0-39.9 in adult 12/15/2011   Vitamin D deficiency 05/10/2011    Past Medical History:  Diagnosis Date   Allergy    seasonal   Asthma    Chronic kidney disease    stones   Diabetes mellitus without complication (HCC)    prediabetic and on metformin for wt loss   Eczema    Headache(784.0)    migraines   Hypertension    PONV (postoperative nausea and vomiting)     Past Surgical History:  Procedure Laterality Date   ABDOMINAL HYSTERECTOMY     CESAREAN SECTION     x 2   CYSTOSCOPY  01/10/2011   Procedure: CYSTOSCOPY;  Surgeon: Edwena Felty Romine;  Location: [facility];  Service: Gynecology;  Laterality: N/A;   ENDOMETRIAL ABLATION  2005   HERNIA REPAIR     LIPOMA EXCISION     cs    Social History   Socioeconomic History   Marital status: Married    Spouse name: Not on file   Number of children: Not on file   Years of education: Not on file   Highest education level: Not on file  Occupational History   Not on file  Tobacco Use   Smoking status: Never   Smokeless tobacco: Never  Vaping Use   Vaping status: Never Used  Substance and Sexual Activity   Alcohol use: No    Comment: occ   Drug use: No   Sexual activity: Not Currently  Other Topics Concern   Not on file  Social History Narrative   Not on file   Social Determinants of Health   Financial Resource  Strain: Not on file  Food Insecurity: Not on file  Transportation Needs: Not on file  Physical Activity: Not on file  Stress: Not on file  Social Connections: Unknown (07/06/2021)   Received from [facility]   Social Network    Social Network: Not on file  Intimate Partner Violence: Unknown (05/28/2021)   Received from [facility]   HITS    Physically Hurt: Not on file    Insult or Talk Down To: Not on file    Threaten Physical Harm: Not on file    Scream or Curse: Not on file    Family History  Problem Relation Age of Onset   Multiple sclerosis Mother    Hypertension Father    Allergies Brother    Healthy Brother    Allergic rhinitis Paternal Aunt    Asthma Paternal Aunt    Allergic rhinitis Paternal Grandmother    Asthma Paternal Grandmother    Healthy Daughter    Healthy Son    Colon cancer Neg Hx    Colon polyps Neg Hx    Esophageal cancer Neg Hx    Rectal cancer Neg Hx    Stomach cancer Neg Hx      Review of Systems  Constitutional: Negative.  Negative for chills and fever.  HENT: Negative.  Negative for congestion and sore throat.   Respiratory: Negative.  Negative for cough and shortness of breath.   Cardiovascular: Negative.  Negative for chest pain and palpitations.  Gastrointestinal:  Negative for abdominal pain, nausea and vomiting.  Genitourinary: Negative.   Skin: Negative.  Negative for rash.  Neurological: Negative.  Negative for dizziness and headaches.  All other systems reviewed and are negative.   Vitals:   09/22/22 0939  BP: 128/74  Pulse: 67  Temp: 98.3 F (36.8 C)  SpO2: 98%    Physical Exam Vitals reviewed.  Constitutional:      Appearance: Normal appearance.  HENT:     Head: Normocephalic.     Mouth/Throat:     Mouth: Mucous membranes are moist.     Pharynx: Oropharynx is clear.  Eyes:     Extraocular Movements: Extraocular movements intact.     Pupils: Pupils are equal, round, and reactive to  light.  Cardiovascular:     Rate and Rhythm: Normal rate and regular rhythm.     Pulses: Normal pulses.     Heart sounds: Normal heart sounds.  Pulmonary:     Effort: Pulmonary effort is normal.     Breath sounds: Normal breath sounds.  Musculoskeletal:     Cervical back: No tenderness.  Lymphadenopathy:  Cervical: No cervical adenopathy.  Skin:    General: Skin is warm and dry.     Capillary Refill: Capillary refill takes less than 2 seconds.  Neurological:     General: No focal deficit present.     Mental Status: She is alert and oriented to person, place, and time.  Psychiatric:        Mood and Affect: Mood normal.        Behavior: Behavior normal.      ASSESSMENT & PLAN: A total of 43 minutes was spent with the patient and counseling/coordination of care regarding preparing for this visit, review of most recent office visit notes, review of multiple chronic medical conditions and their management, review of all medications, cardiovascular risks associated with prediabetes, education on nutrition, review of most recent blood work results, prognosis, documentation and need for follow-up.  Problem List Items Addressed This Visit       Respiratory   Moderate persistent asthma, uncomplicated    Stable and well-controlled. No concerns.        Other   Sjogren's syndrome with keratoconjunctivitis sicca (HCC)    Stable and well-controlled.  No concerns.      Prediabetes - Primary    Eating better and losing weight. On Wegovy 1 mg weekly Continues metformin 500 mg twice a day Cardiovascular risk associated with diabetes discussed Diet and nutrition discussed Blood work done today including hemoglobin A1c      Relevant Orders   Comprehensive metabolic panel   Hemoglobin A1c   Lipid panel   Chronic anemia    Stable and well-controlled.  No concerns.      Relevant Orders   CBC with Differential/Platelet   Patient Instructions  Health Maintenance,  Female Adopting a healthy lifestyle and getting preventive care are important in promoting health and wellness. Ask your health care provider about: The right schedule for you to have regular tests and exams. Things you can do on your own to prevent diseases and keep yourself healthy. What should I know about diet, weight, and exercise? Eat a healthy diet  Eat a diet that includes plenty of vegetables, fruits, low-fat dairy products, and lean protein. Do not eat a lot of foods that are high in solid fats, added sugars, or sodium. Maintain a healthy weight Body mass index (BMI) is used to identify weight problems. It estimates body fat based on height and weight. Your health care provider can help determine your BMI and help you achieve or maintain a healthy weight. Get regular exercise Get regular exercise. This is one of the most important things you can do for your health. Most adults should: Exercise for at least 150 minutes each week. The exercise should increase your heart rate and make you sweat (moderate-intensity exercise). Do strengthening exercises at least twice a week. This is in addition to the moderate-intensity exercise. Spend less time sitting. Even light physical activity can be beneficial. Watch cholesterol and blood lipids Have your blood tested for lipids and cholesterol at 51 years of age, then have this test every 5 years. Have your cholesterol levels checked more often if: Your lipid or cholesterol levels are high. You are older than 51 years of age. You are at high risk for heart disease. What should I know about cancer screening? Depending on your health history and family history, you may need to have cancer screening at various ages. This may include screening for: Breast cancer. Cervical cancer. Colorectal cancer. Skin cancer. Lung cancer. What  should I know about heart disease, diabetes, and high blood pressure? Blood pressure and heart disease High blood  pressure causes heart disease and increases the risk of stroke. This is more likely to develop in people who have high blood pressure readings or are overweight. Have your blood pressure checked: Every 3-5 years if you are 52-65 years of age. Every year if you are 51 years old or older. Diabetes Have regular diabetes screenings. This checks your fasting blood sugar level. Have the screening done: Once every three years after age 84 if you are at a normal weight and have a low risk for diabetes. More often and at a younger age if you are overweight or have a high risk for diabetes. What should I know about preventing infection? Hepatitis B If you have a higher risk for hepatitis B, you should be screened for this virus. Talk with your health care provider to find out if you are at risk for hepatitis B infection. Hepatitis C Testing is recommended for: Everyone born from [birth year] through 1964. Anyone with known risk factors for hepatitis C. Sexually transmitted infections (STIs) Get screened for STIs, including gonorrhea and chlamydia, if: You are sexually active and are younger than 51 years of age. You are older than 51 years of age and your health care provider tells you that you are at risk for this type of infection. Your sexual activity has changed since you were last screened, and you are at increased risk for chlamydia or gonorrhea. Ask your health care provider if you are at risk. Ask your health care provider about whether you are at high risk for HIV. Your health care provider may recommend a prescription medicine to help prevent HIV infection. If you choose to take medicine to prevent HIV, you should first get tested for HIV. You should then be tested every 3 months for as long as you are taking the medicine. Pregnancy If you are about to stop having your period (premenopausal) and you may become pregnant, seek counseling before you get pregnant. Take 400 to 800 micrograms (mcg) of folic  acid every day if you become pregnant. Ask for birth control (contraception) if you want to prevent pregnancy. Osteoporosis and menopause Osteoporosis is a disease in which the bones lose minerals and strength with aging. This can result in bone fractures. If you are 32 years old or older, or if you are at risk for osteoporosis and fractures, ask your health care provider if you should: Be screened for bone loss. Take a calcium or vitamin D supplement to lower your risk of fractures. Be given hormone replacement therapy (HRT) to treat symptoms of menopause. Follow these instructions at home: Alcohol use Do not drink alcohol if: Your health care provider tells you not to drink. You are pregnant, may be pregnant, or are planning to become pregnant. If you drink alcohol: Limit how much you have to: 0-1 drink a day. Know how much alcohol is in your drink. In the [place] one drink equals one 12 oz bottle of beer (355 mL), one 5 oz glass of wine (148 mL), or one 1 oz glass of hard liquor (44 mL). Lifestyle Do not use any products that contain nicotine or tobacco. These products include cigarettes, chewing tobacco, and vaping devices, such as e-cigarettes. If you need help quitting, ask your health care provider. Do not use street drugs. Do not share needles. Ask your health care provider for help if you need support or information about  quitting drugs. General instructions Schedule regular health, dental, and eye exams. Stay current with your vaccines. Tell your health care provider if: You often feel depressed. You have ever been abused or do not feel safe at home. Summary Adopting a healthy lifestyle and getting preventive care are important in promoting health and wellness. Follow your health care provider's instructions about healthy diet, exercising, and getting tested or screened for diseases. Follow your health care provider's instructions on monitoring your cholesterol and blood  pressure. This information is not intended to replace advice given to you by your health care provider. Make sure you discuss any questions you have with your health care provider. Document Revised: 06/29/2020 Document Reviewed: 06/29/2020 Elsevier Patient Education  2024 Elsevier [employer].      Edwina Barth, MD [facility]

## 2023-02-24 LAB — LAB REPORT - SCANNED: A1c: 5.6

## 2023-03-03 NOTE — Telephone Encounter (Signed)
 Verified patient by full name and DOB Pharmacy calling for a refill for patient on Dupixent. Patient was last seen at our office in 2023 but does have upcoming appointment on 03/22/23 and once she is seen we will be able to submit a refill at that time. Melissa Keiger Mabe, CMA

## 2023-03-30 ENCOUNTER — Ambulatory Visit (INDEPENDENT_AMBULATORY_CARE_PROVIDER_SITE_OTHER): Payer: BC Managed Care – PPO

## 2023-03-30 ENCOUNTER — Ambulatory Visit: Payer: BC Managed Care – PPO | Admitting: Emergency Medicine

## 2023-03-30 ENCOUNTER — Encounter: Payer: Self-pay | Admitting: Emergency Medicine

## 2023-03-30 VITALS — BP 88/68 | HR 92 | Temp 98.4°F | Ht 65.0 in | Wt 200.0 lb

## 2023-03-30 DIAGNOSIS — D649 Anemia, unspecified: Secondary | ICD-10-CM

## 2023-03-30 DIAGNOSIS — L508 Other urticaria: Secondary | ICD-10-CM

## 2023-03-30 DIAGNOSIS — R6889 Other general symptoms and signs: Secondary | ICD-10-CM | POA: Diagnosis not present

## 2023-03-30 DIAGNOSIS — J4540 Moderate persistent asthma, uncomplicated: Secondary | ICD-10-CM | POA: Diagnosis not present

## 2023-03-30 DIAGNOSIS — I959 Hypotension, unspecified: Secondary | ICD-10-CM | POA: Diagnosis not present

## 2023-03-30 DIAGNOSIS — J22 Unspecified acute lower respiratory infection: Secondary | ICD-10-CM

## 2023-03-30 DIAGNOSIS — M3501 Sicca syndrome with keratoconjunctivitis: Secondary | ICD-10-CM

## 2023-03-30 LAB — POCT INFLUENZA A/B
Influenza A, POC: NEGATIVE
Influenza B, POC: NEGATIVE

## 2023-03-30 LAB — COMPREHENSIVE METABOLIC PANEL
ALT: 29 U/L (ref 0–35)
AST: 29 U/L (ref 0–37)
Albumin: 3.9 g/dL (ref 3.5–5.2)
Alkaline Phosphatase: 68 U/L (ref 39–117)
BUN: 17 mg/dL (ref 6–23)
CO2: 29 meq/L (ref 19–32)
Calcium: 9.4 mg/dL (ref 8.4–10.5)
Chloride: 103 meq/L (ref 96–112)
Creatinine, Ser: 0.95 mg/dL (ref 0.40–1.20)
GFR: 69.11 mL/min (ref >60.00)
Glucose, Bld: 91 mg/dL (ref 70–99)
Potassium: 4 meq/L (ref 3.5–5.1)
Sodium: 137 meq/L (ref 135–145)
Total Bilirubin: 0.3 mg/dL (ref 0.2–1.2)
Total Protein: 9.3 g/dL — ABNORMAL HIGH (ref 6.0–8.3)

## 2023-03-30 LAB — CBC WITH DIFFERENTIAL/PLATELET
Basophils Absolute: 0 10*3/uL (ref 0.0–0.1)
Basophils Relative: 0.9 % (ref 0.0–3.0)
Eosinophils Absolute: 0.1 10*3/uL (ref 0.0–0.7)
Eosinophils Relative: 3.4 % (ref 0.0–5.0)
HCT: 37.1 % (ref 36.0–46.0)
Hemoglobin: 12.1 g/dL (ref 12.0–15.0)
Lymphocytes Relative: 23.6 % (ref 12.0–46.0)
Lymphs Abs: 1 10*3/uL (ref 0.7–4.0)
MCHC: 32.5 g/dL (ref 30.0–36.0)
MCV: 89.3 fL (ref 78.0–100.0)
Monocytes Absolute: 0.5 10*3/uL (ref 0.1–1.0)
Monocytes Relative: 12.8 % — ABNORMAL HIGH (ref 3.0–12.0)
Neutro Abs: 2.5 10*3/uL (ref 1.4–7.7)
Neutrophils Relative %: 59.3 % (ref 43.0–77.0)
Platelets: 252 10*3/uL (ref 150.0–400.0)
RBC: 4.16 Mil/uL (ref 3.87–5.11)
RDW: 13.9 % (ref 11.5–15.5)
WBC: 4.1 10*3/uL (ref 4.0–10.5)

## 2023-03-30 LAB — LIPID PANEL
Cholesterol: 149 mg/dL (ref 0–200)
HDL: 65 mg/dL (ref >39.00)
LDL Cholesterol: 74 mg/dL (ref 0–99)
NonHDL: 84.21
Total CHOL/HDL Ratio: 2
Triglycerides: 52 mg/dL (ref 0.0–149.0)
VLDL: 10.4 mg/dL (ref 0.0–40.0)

## 2023-03-30 LAB — HEMOGLOBIN A1C: Hgb A1c MFr Bld: 5.8 % (ref 4.6–6.5)

## 2023-03-30 LAB — TSH: TSH: 0.87 u[IU]/mL (ref 0.35–5.50)

## 2023-03-30 LAB — POC COVID19 BINAXNOW: SARS Coronavirus 2 Ag: NEGATIVE

## 2023-03-30 MED ORDER — ALBUTEROL SULFATE HFA 108 (90 BASE) MCG/ACT IN AERS: INHALATION_SPRAY | RESPIRATORY_TRACT | 0 refills | Status: AC

## 2023-03-30 NOTE — Assessment & Plan Note (Signed)
 Symptom management discussed. Advised to rest and stay well-hydrated Advised to contact the office if no better or worse during the next several days

## 2023-03-30 NOTE — Telephone Encounter (Signed)
-    Hi Amber,  We were not.  This patient secured assistance last year working directly with Dup[employer].  We were not involved in this process.  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    Thank you, Roselyn Comp, CPhT [facility] Medication Assistance Team

## 2023-03-30 NOTE — Telephone Encounter (Signed)
 Please reroute Dupixent to [facility] per insurance requirement. Phone number is [phone].  Jeoffrey Hancock, PharmD [facility] [phone]

## 2023-03-30 NOTE — Progress Notes (Signed)
 Misty Barnes 53 y.o.   Chief Complaint  Patient presents with   Follow-up    6 month f/u for DM. Patient c/o of ear/ throat hurts, dizziness, congested for the last 2 days. Will need work note     HISTORY OF PRESENT ILLNESS: Here for 14-month follow-up of chronic medical conditions. This is a 53 y.o. female complaining of flulike symptoms that started last Sunday, 4 days ago. She works at a [employer].  Complaining of feeling dizzy with ear and throat pain and congestion No other associated symptoms.  Able to eat and drink.  Denies difficulty breathing, abdominal pain, nausea and vomiting. Also she was recently told she needed thyroid medication based on the blood results. Lab Results  Component Value Date   HGBA1C 5.9 09/22/2022   Wt Readings from Last 3 Encounters:  09/22/22 196 lb 4 oz (89 kg)  02/17/22 207 lb 6 oz (94.1 kg)  08/10/21 220 lb 6 oz (100 kg)   BP Readings from Last 3 Encounters:  09/22/22 128/74  02/17/22 128/78  08/10/21 100/66      HPI   Prior to Admission medications   Medication Sig Start Date End Date Taking? Authorizing Provider  albuterol  (PROAIR  HFA) 108 (90 Base) MCG/ACT inhaler Inhale 4 puffs every 4-6 hours as needed for cough, wheeze or shortness of breath 02/17/22   Purcell Emil Schanz, MD  blood glucose meter kit and supplies KIT Dispense based on patient and insurance preference. Use up to four times daily as directed. 08/10/21   Sebasthian Stailey Jose, MD  doxycycline  (VIBRAMYCIN ) 50 MG capsule Take by mouth. Patient not taking: Reported on 09/22/2022 02/26/20   [provider]  DUPIXENT 300 MG/2ML prefilled syringe  04/24/19   [provider]  EPINEPHrine  0.3 mg/0.3 mL IJ SOAJ injection Use as directed for severe allergic reaction 02/19/18   Iva Marty Saltness, MD  metFORMIN  (GLUCOPHAGE ) 500 MG tablet TAKE 1 TABLET(500 MG) BY MOUTH TWICE DAILY WITH A MEAL 07/30/22   Quitman Norberto, Emil Schanz, MD  OZEMPIC , 1 MG/DOSE, 4 MG/3ML SOPN  INJECT 1 MG UNDER THE SKIN ONE DAY A WEEK AS DIRECTED 05/28/22   Purcell Emil Schanz, MD  PREVIDENT 5000 BOOSTER PLUS 1.1 % PSTE Place onto teeth. 08/02/20   [provider]  RESTASIS 0.05 % ophthalmic emulsion  12/02/20   [provider]  valACYclovir (VALTREX) 500 MG tablet Take 1 tablet by mouth daily. 03/07/19   [provider]  DULoxetine (CYMBALTA) 60 MG capsule Take 60 mg by mouth daily.    05/10/11  [provider]    Allergies  Allergen Reactions   Fish Allergy Anaphylaxis   Penicillins Anaphylaxis   Latex    Percocet [Oxycodone -Acetaminophen ] Nausea And Vomiting   Plaquenil  [Hydroxychloroquine  Sulfate]     rash   Sulfa Antibiotics     Unknown reaction    Patient Active Problem List   Diagnosis Date Noted   Chronic anemia 05/20/2021   History of atopic dermatitis 11/09/2020   Dry eyes 11/09/2020   Prediabetes 11/09/2020   Chronic urticaria 06/15/2018   Sjogren's syndrome with keratoconjunctivitis sicca (HCC) 03/28/2018   Moderate persistent asthma, uncomplicated 12/31/2015   Body mass index (BMI) of 39.0-39.9 in adult 12/15/2011   Vitamin D  deficiency 05/10/2011    Past Medical History:  Diagnosis Date   Allergy    seasonal   Asthma    Chronic kidney disease    stones   Diabetes mellitus without complication (HCC)  prediabetic and on metformin  for wt loss   Eczema    Headache(784.0)    migraines   Hypertension    PONV (postoperative nausea and vomiting)     Past Surgical History:  Procedure Laterality Date   ABDOMINAL HYSTERECTOMY     CESAREAN SECTION     x 2   CYSTOSCOPY  01/10/2011   Procedure: CYSTOSCOPY;  Surgeon: Montie SQUIBB Romine;  Location: [facility];  Service: Gynecology;  Laterality: N/A;   ENDOMETRIAL ABLATION  2005   HERNIA REPAIR     LIPOMA EXCISION     cs    Social History   Socioeconomic History   Marital status: Married    Spouse name: Not on file   Number of children: Not on file   Years of  education: Not on file   Highest education level: Not on file  Occupational History   Not on file  Tobacco Use   Smoking status: Never   Smokeless tobacco: Never  Vaping Use   Vaping status: Never Used  Substance and Sexual Activity   Alcohol use: No    Comment: occ   Drug use: No   Sexual activity: Not Currently  Other Topics Concern   Not on file  Social History Narrative   Not on file   Social Drivers of Health   Financial Resource Strain: Not on file  Food Insecurity: Not on file  Transportation Needs: Not on file  Physical Activity: Not on file  Stress: Not on file  Social Connections: Unknown (07/06/2021)   Received from [facility]   Social Network    Social Network: Not on file  Intimate Partner Violence: Unknown (05/28/2021)   Received from [facility]   HITS    Physically Hurt: Not on file    Insult or Talk Down To: Not on file    Threaten Physical Harm: Not on file    Scream or Curse: Not on file    Family History  Problem Relation Age of Onset   Multiple sclerosis Mother    Hypertension Father    Allergies Brother    Healthy Brother    Allergic rhinitis Paternal Aunt    Asthma Paternal Aunt    Allergic rhinitis Paternal Grandmother    Asthma Paternal Grandmother    Healthy Daughter    Healthy Son    Colon cancer Neg Hx    Colon polyps Neg Hx    Esophageal cancer Neg Hx    Rectal cancer Neg Hx    Stomach cancer Neg Hx      Review of Systems  Constitutional: Negative.  Negative for chills and fever.  HENT: Negative.  Negative for congestion and sore throat.   Respiratory: Negative.  Negative for cough and shortness of breath.   Cardiovascular: Negative.  Negative for chest pain and palpitations.  Gastrointestinal: Negative.  Negative for abdominal pain, diarrhea, nausea and vomiting.  Genitourinary: Negative.  Negative for dysuria and hematuria.  Skin: Negative.  Negative for rash.  Neurological: Negative.   Negative for dizziness and headaches.  All other systems reviewed and are negative.   Today's Vitals   03/30/23 1006  BP: (!) 88/68  Pulse: 92  Temp: 98.4 F (36.9 C)  SpO2: 96%  Weight: 200 lb (90.7 kg)  Height: 5' 5 (1.651 m)   Body mass index is 33.28 kg/m.   Physical Exam Vitals reviewed.  Constitutional:      Appearance: Normal appearance.  HENT:  Head: Normocephalic.  Eyes:     Extraocular Movements: Extraocular movements intact.  Cardiovascular:     Rate and Rhythm: Normal rate.  Pulmonary:     Effort: Pulmonary effort is normal.  Skin:    General: Skin is warm and dry.  Neurological:     Mental Status: She is alert and oriented to person, place, and time.  Psychiatric:        Mood and Affect: Mood normal.        Behavior: Behavior normal.    DG Chest 2 View Result Date: 03/30/2023 CLINICAL DATA:  Cough and congestion. Chest discomfort. Flu like symptoms. EXAM: CHEST - 2 VIEW COMPARISON:  None Available. FINDINGS: No focal consolidation, pleural effusion, or pneumothorax. The cardiac silhouette is within normal limits. No acute osseous pathology. IMPRESSION: No active cardiopulmonary disease. Electronically Signed   By: Vanetta Chou M.D.   On: 03/30/2023 11:45   Results for orders placed or performed in visit on 03/30/23 (from the past 24 hours)  POC COVID-19     Status: None   Collection Time: 03/30/23  1:05 PM  Result Value Ref Range   SARS Coronavirus 2 Ag Negative Negative  POCT Influenza A/B     Status: None   Collection Time: 03/30/23  1:05 PM  Result Value Ref Range   Influenza A, POC Negative Negative   Influenza B, POC Negative Negative     ASSESSMENT & PLAN: A total of 45 minutes was spent with the patient and counseling/coordination of care regarding preparing for this visit, review of most recent office visit notes, review of multiple chronic medical conditions and their management, review of all medications, diagnosis of lower  respiratory infection and need for antibiotics, review of chest x-ray report done today, review of most recent bloodwork results, review of health maintenance items, education on nutrition, prognosis, documentation, and need for follow up.   Problem List Items Addressed This Visit       Cardiovascular and Mediastinum   Hypotension - Primary   Most likely related to dehydration and viral illness with possible secondary bacterial infection Advised to rest and stay well-hydrated No work until next Monday Blood work done today Advised to contact the office if no better or worse during the next several days      Relevant Orders   Comprehensive metabolic panel   TSH   Hemoglobin A1c   Lipid panel     Respiratory   Moderate persistent asthma, uncomplicated   Stable and well-controlled. No concerns.      Relevant Medications   albuterol  (PROAIR  HFA) 108 (90 Base) MCG/ACT inhaler   Lower respiratory infection   Negative COVID and flu tests Viral illness now with secondary bacterial infection Recommend daily azithromycin for 5 days. Chest x-ray done today.  Report reviewed.         Musculoskeletal and Integument   Chronic urticaria   Stable.  Well-controlled.  Sees dermatologist on a regular basis.        Other   Sjogren's syndrome with keratoconjunctivitis sicca (HCC)   Stable and well-controlled. No concerns       Chronic anemia   Stable and well-controlled. No concerns.       Relevant Orders   CBC with Differential/Platelet   Flu-like symptoms   Symptom management discussed. Advised to rest and stay well-hydrated Advised to contact the office if no better or worse during the next several days      Relevant Orders   POC COVID-19  POCT Influenza A/B   CBC with Differential/Platelet   Comprehensive metabolic panel   TSH   Hemoglobin A1c   Lipid panel   DG Chest 2 View   Patient Instructions  Health Maintenance, Female Adopting a healthy lifestyle and  getting preventive care are important in promoting health and wellness. Ask your health care provider about: The right schedule for you to have regular tests and exams. Things you can do on your own to prevent diseases and keep yourself healthy. What should I know about diet, weight, and exercise? Eat a healthy diet  Eat a diet that includes plenty of vegetables, fruits, low-fat dairy products, and lean protein. Do not eat a lot of foods that are high in solid fats, added sugars, or sodium. Maintain a healthy weight Body mass index (BMI) is used to identify weight problems. It estimates body fat based on height and weight. Your health care provider can help determine your BMI and help you achieve or maintain a healthy weight. Get regular exercise Get regular exercise. This is one of the most important things you can do for your health. Most adults should: Exercise for at least 150 minutes each week. The exercise should increase your heart rate and make you sweat (moderate-intensity exercise). Do strengthening exercises at least twice a week. This is in addition to the moderate-intensity exercise. Spend less time sitting. Even light physical activity can be beneficial. Watch cholesterol and blood lipids Have your blood tested for lipids and cholesterol at 52 years of age, then have this test every 5 years. Have your cholesterol levels checked more often if: Your lipid or cholesterol levels are high. You are older than 52 years of age. You are at high risk for heart disease. What should I know about cancer screening? Depending on your health history and family history, you may need to have cancer screening at various ages. This may include screening for: Breast cancer. Cervical cancer. Colorectal cancer. Skin cancer. Lung cancer. What should I know about heart disease, diabetes, and high blood pressure? Blood pressure and heart disease High blood pressure causes heart disease and  increases the risk of stroke. This is more likely to develop in people who have high blood pressure readings or are overweight. Have your blood pressure checked: Every 3-5 years if you are 50-72 years of age. Every year if you are 80 years old or older. Diabetes Have regular diabetes screenings. This checks your fasting blood sugar level. Have the screening done: Once every three years after age 10 if you are at a normal weight and have a low risk for diabetes. More often and at a younger age if you are overweight or have a high risk for diabetes. What should I know about preventing infection? Hepatitis B If you have a higher risk for hepatitis B, you should be screened for this virus. Talk with your health care provider to find out if you are at risk for hepatitis B infection. Hepatitis C Testing is recommended for: Everyone born from [birth year] through 1964. Anyone with known risk factors for hepatitis C. Sexually transmitted infections (STIs) Get screened for STIs, including gonorrhea and chlamydia, if: You are sexually active and are younger than 52 years of age. You are older than 52 years of age and your health care provider tells you that you are at risk for this type of infection. Your sexual activity has changed since you were last screened, and you are at increased risk for chlamydia or gonorrhea. Ask  your health care provider if you are at risk. Ask your health care provider about whether you are at high risk for HIV. Your health care provider may recommend a prescription medicine to help prevent HIV infection. If you choose to take medicine to prevent HIV, you should first get tested for HIV. You should then be tested every 3 months for as long as you are taking the medicine. Pregnancy If you are about to stop having your period (premenopausal) and you may become pregnant, seek counseling before you get pregnant. Take 400 to 800 micrograms (mcg) of folic acid every day if you become  pregnant. Ask for birth control (contraception) if you want to prevent pregnancy. Osteoporosis and menopause Osteoporosis is a disease in which the bones lose minerals and strength with aging. This can result in bone fractures. If you are 49 years old or older, or if you are at risk for osteoporosis and fractures, ask your health care provider if you should: Be screened for bone loss. Take a calcium or vitamin D  supplement to lower your risk of fractures. Be given hormone replacement therapy (HRT) to treat symptoms of menopause. Follow these instructions at home: Alcohol use Do not drink alcohol if: Your health care provider tells you not to drink. You are pregnant, may be pregnant, or are planning to become pregnant. If you drink alcohol: Limit how much you have to: 0-1 drink a day. Know how much alcohol is in your drink. In the [place] one drink equals one 12 oz bottle of beer (355 mL), one 5 oz glass of wine (148 mL), or one 1 oz glass of hard liquor (44 mL). Lifestyle Do not use any products that contain nicotine or tobacco. These products include cigarettes, chewing tobacco, and vaping devices, such as e-cigarettes. If you need help quitting, ask your health care provider. Do not use street drugs. Do not share needles. Ask your health care provider for help if you need support or information about quitting drugs. General instructions Schedule regular health, dental, and eye exams. Stay current with your vaccines. Tell your health care provider if: You often feel depressed. You have ever been abused or do not feel safe at home. Summary Adopting a healthy lifestyle and getting preventive care are important in promoting health and wellness. Follow your health care provider's instructions about healthy diet, exercising, and getting tested or screened for diseases. Follow your health care provider's instructions on monitoring your cholesterol and blood pressure. This information is not  intended to replace advice given to you by your health care provider. Make sure you discuss any questions you have with your health care provider. Document Revised: 06/29/2020 Document Reviewed: 06/29/2020 Elsevier Patient Education  2024 Elsevier Inc.    Emil Schaumann, MD [facility]

## 2023-03-30 NOTE — Assessment & Plan Note (Signed)
 Most likely related to dehydration and viral illness with possible secondary bacterial infection Advised to rest and stay well-hydrated No work until next Monday Blood work done today Advised to contact the office if no better or worse during the next several days

## 2023-03-30 NOTE — Telephone Encounter (Signed)
Script transferred as requested

## 2023-03-30 NOTE — Assessment & Plan Note (Signed)
Stable.  Well-controlled.  Sees dermatologist on a regular basis.

## 2023-03-30 NOTE — Assessment & Plan Note (Signed)
Stable and well-controlled.  No concerns.

## 2023-03-30 NOTE — Assessment & Plan Note (Signed)
 Negative COVID and flu tests Viral illness now with secondary bacterial infection Recommend daily azithromycin for 5 days. Chest x-ray done today.  Report reviewed.

## 2023-03-30 NOTE — Patient Instructions (Signed)

## 2023-03-31 NOTE — Telephone Encounter (Signed)
 Nurses- can you please call pt and make sure she is aware that she needs to fill at [facility] specialty pharmacy? You will also need to call [facility] and notify them that pt no longer needs PAP as she has an approved authorization with her insurance

## 2023-09-08 ENCOUNTER — Encounter: Payer: Self-pay | Admitting: Advanced Practice Midwife

## 2023-12-04 ENCOUNTER — Ambulatory Visit: Admitting: Family Medicine

## 2023-12-04 ENCOUNTER — Ambulatory Visit: Payer: Self-pay

## 2023-12-04 NOTE — Telephone Encounter (Signed)
 FYI Only or Action Required?: FYI only for provider.  Patient was last seen in primary care on 03/30/2023 by Purcell Emil Schanz, MD.  Called Nurse Triage reporting Dysuria.  Symptoms began yesterday.  Interventions attempted: OTC medications: Tylenol  and Ibuprofen  and Rest, hydration, or home remedies.  Symptoms are: gradually improving.  Triage Disposition: See Physician Within 24 Hours  Patient/caregiver understands and will follow disposition?: Yes Copied from CRM #[id number]. Topic: Clinical - Red Word Triage >> Dec 04, 2023  9:17 AM Kevelyn M wrote: Left flank pain starting Sunday morning. Feeling nauseated. Experiencing bleeding while urinating and patient believes she is passing kidney stones yesterday.   Reason for Disposition  Pain or burning with passing urine  Answer Assessment - Initial Assessment Questions 1. COLOR of URINE: Describe the color of the urine.  (e.g., tea-colored, pink, red, bloody) Do you have blood clots in your urine? (e.g., none, pea, grape, small coin)     Red urine with pebbles noted at bottom of toilet starting Sunday. Urine pink today.  2. ONSET: When did the bleeding start?      Sunday through today.  3. EPISODES: How many times has there been blood in the urine? or How many times today?     Several times starting Sunday. Urine started out red, now pink in color. 2 episodes of pebbles in toilet.  4. PAIN with URINATION: Is there any pain with passing your urine? If Yes, ask: How bad is the pain?  (Scale 1-10; or mild, moderate, severe)     Pain started as 8/10 pain on Sunday after first epidose of blood in urine with pebbles on bottom of toilet. Pain decreased to 5/10 after episode. Passed more blood and pebbles again this morning, pain down to 2/10 now.  5. FEVER: Do you have a fever? If Yes, ask: What is your temperature, how was it measured, and when did it start?     Fever last night 101.4 F. This morning 98.7 F.  6.  ASSOCIATED SYMPTOMS: Are you passing urine more frequently than usual?     More frequently d/t increased fluid intake.  7. OTHER SYMPTOMS: Do you have any other symptoms? (e.g., back/flank pain, abdomen pain, vomiting)     Left flank pain, nausea, abdominal fullness.  Protocols used: Urine - Blood In-A-AH

## 2023-12-11 ENCOUNTER — Other Ambulatory Visit: Payer: Self-pay | Admitting: Urology

## 2023-12-19 NOTE — Patient Instructions (Signed)
 SURGICAL WAITING ROOM VISITATION  Patients having surgery or a procedure may have no more than 2 support people in the waiting area - these visitors may rotate.    Children under the age of 85 must have an adult with them who is not the patient.  Visitors with respiratory illnesses are discouraged from visiting and should remain at home.  If the patient needs to stay at the hospital during part of their recovery, the visitor guidelines for inpatient rooms apply. Pre-op nurse will coordinate an appropriate time for 1 support person to accompany patient in pre-op.  This support person may not rotate.    Please refer to the [facility] website for the visitor guidelines for Inpatients (after your surgery is over and you are in a regular room).       Your procedure is scheduled on: 12/21/23   Report to [facility] Main Entrance    Report to admitting at 9:15 AM   Call this number if you have problems the morning of surgery [phone]   Do not eat food or drink liquids:After Midnight.but may have sips of water  to take meds.    Oral Hygiene is also important to reduce your risk of infection.                                    Remember - BRUSH YOUR TEETH THE MORNING OF SURGERY WITH YOUR REGULAR TOOTHPASTE   Stop all vitamins and herbal supplements 7 days before surgery.   Take these medicines the morning of surgery with A SIP OF WATER : ondansetron (zofran ), inhaler, oxycodone , eye drops  DO NOT TAKE ANY ORAL DIABETIC MEDICATIONS DAY OF YOUR SURGERY Hold Metformin  the morning of surgery.              You may not have any metal on your body including hair pins, jewelry, and body piercing             Do not wear make-up, lotions, powders, perfumes/cologne, or deodorant  Do not wear nail polish including gel and S&S, artificial/acrylic nails, or any other type of covering on natural nails including finger and toenails. If you have artificial nails, gel coating, etc. that needs  to be removed by a nail salon please have this removed prior to surgery or surgery may need to be canceled/ delayed if the surgeon/ anesthesia feels like they are unable to be safely monitored.   Do not shave  48 hours prior to surgery.    Do not bring valuables to the hospital. [place] IS NOT             RESPONSIBLE   FOR VALUABLES.   Contacts, glasses, dentures or bridgework may not be worn into surgery.    DO NOT BRING YOUR HOME MEDICATIONS TO THE HOSPITAL. PHARMACY WILL DISPENSE MEDICATIONS LISTED ON YOUR MEDICATION LIST TO YOU DURING YOUR ADMISSION IN THE HOSPITAL!    Patients discharged on the day of surgery will not be allowed to drive home.  Someone NEEDS to stay with you for the first 24 hours after anesthesia.   Special Instructions: Bring a copy of your healthcare power of attorney and living will documents the day of surgery if you haven't scanned them before.              Please read over the following fact sheets you were given: IF YOU HAVE QUESTIONS ABOUT YOUR PRE-OP  INSTRUCTIONS PLEASE [phone] [place]   If you received a COVID test during your pre-op visit  it is requested that you wear a mask when out in public, stay away from anyone that may not be feeling well and notify your surgeon if you develop symptoms. If you test positive for Covid or have been in contact with anyone that has tested positive in the last 10 days please notify you surgeon.    [place] - Preparing for Surgery Before surgery, you can play an important role.  Because skin is not sterile, your skin needs to be as free of germs as possible.  You can reduce the number of germs on your skin by washing with CHG (chlorahexidine gluconate) soap before surgery.  CHG is an antiseptic cleaner which kills germs and bonds with the skin to continue killing germs even after washing. Please DO NOT use if you have an allergy to CHG or antibacterial soaps.  If your skin becomes reddened/irritated stop using  the CHG and inform your nurse when you arrive at Short Stay. Do not shave (including legs and underarms) for at least 48 hours prior to the first CHG shower.  You may shave your face/neck.  Please follow these instructions carefully:  1.  Shower with CHG Soap the night before surgery and the morning of surgery.  2.  If you choose to wash your hair, wash your hair first as usual with your normal  shampoo.  3.  After you shampoo, rinse your hair and body thoroughly to remove the shampoo.                             4.  Use CHG as you would any other liquid soap.  You can apply chg directly to the skin and wash.  Gently with a scrungie or clean washcloth.  5.  Apply the CHG Soap to your body ONLY FROM THE NECK DOWN.   Do   not use on face/ open                           Wound or open sores. Avoid contact with eyes, ears mouth and   genitals (private parts).                       Wash face,  Genitals (private parts) with your normal soap.             6.  Wash thoroughly, paying special attention to the area where your    surgery  will be performed.  7.  Thoroughly rinse your body with warm water  from the neck down.  8.  DO NOT shower/wash with your normal soap after using and rinsing off the CHG Soap.                9.  Pat yourself dry with a clean towel.            10.  Wear clean pajamas.            11.  Place clean sheets on your bed the night of your first shower and do not  sleep with pets. Day of Surgery : Do not apply any CHG, lotions/deodorants the morning of surgery.  Please wear clean clothes to the hospital/surgery center.  FAILURE TO FOLLOW THESE INSTRUCTIONS MAY RESULT IN THE CANCELLATION OF YOUR SURGERY  PATIENT SIGNATURE_________________________________  NURSE SIGNATURE__________________________________  ________________________________________________________________________

## 2023-12-19 NOTE — Progress Notes (Signed)
 COVID Vaccine received:  []  No [x]  Yes Date of any COVID positive Test in last 90 days: no PCP - Emil Schaumann MD Cardiologist - n/a  Chest x-ray - 03/30/23 Epic EKG -   Stress Test -  ECHO -  Cardiac Cath -   Bowel Prep - [x]  No  []   Yes ______  Pacemaker / ICD device [x]  No []  Yes   Spinal Cord Stimulator:[x]  No []  Yes       History of Sleep Apnea? [x]  No []  Yes   CPAP used?- [x]  No []  Yes    Does the patient monitor blood sugar?          [x]  No []  Yes  []  N/A  Patient has: []  NO Hx DM   [x]  Pre-DM                 []  DM1  []   DM2 Does patient have a [employer] or Dexacom? [x]  No []  Yes   Fasting Blood Sugar Ranges-  Checks Blood Sugar ___0__ times a day  GLP1 agonist / usual dose - Semaglutide - last dose 12/06/23 GLP1 instructions: will hold until after surgery. SGLT-2 inhibitors / usual dose -  SGLT-2 instructions:   Blood Thinner / Instructions:no Aspirin Instructions:no Comments:   Activity level: Patient is able  to climb a flight of stairs without difficulty; [x]  No CP  [x]  No SOB,  Patient can perform ADLs without assistance.   Anesthesia review:   Patient denies shortness of breath, fever, cough and chest pain at PAT appointment.  Patient verbalized understanding and agreement to the Pre-Surgical Instructions that were given to them at this PAT appointment. Patient was also educated of the need to review these PAT instructions again prior to his/her surgery.I reviewed the appropriate phone numbers to call if they have any and questions or concerns.

## 2023-12-19 NOTE — H&P (Signed)
 H&P Physician requesting consult: Left renal pelvis stone   Chief Complaint: left renal pelvis stone.   History of Present Illness: 53 year old female history of nephrolithiasis for which she has passed several stones in the past. Most recently seen 2018 has been in the past many stones in the past. Nephrolithiasis: 12/04/23: yesterday left flank pain , some GH, patient no longer having pain. No N/V. No F/C. Pt states she had fever last night. Temp this AM 98.7. VS WNL. KUB shows 1cm stone unclear if lower pole. Could not get CT due to insurance not covering. Not in signifcant pain will get f/u in 2 weeks with CT to identify stone location, if lower pole can observe or see if needs operative intervention.  CT showed L renal pelvis stone. Plan for L URS w/ LL today.     Past Medical History:  Diagnosis Date   Allergy    seasonal   Asthma    Chronic kidney disease    stones   Diabetes mellitus without complication (HCC)    prediabetic and on metformin  for wt loss   Eczema    Headache(784.0)    migraines   Hypertension    PONV (postoperative nausea and vomiting)    Past Surgical History:  Procedure Laterality Date   ABDOMINAL HYSTERECTOMY     CESAREAN SECTION     x 2   CYSTOSCOPY  01/10/2011   Procedure: CYSTOSCOPY;  Surgeon: Montie SQUIBB Romine;  Location: [facility];  Service: Gynecology;  Laterality: N/A;   ENDOMETRIAL ABLATION  2005   HERNIA REPAIR     LIPOMA EXCISION     cs    Home Medications:  No medications prior to admission.   Allergies:  Allergies  Allergen Reactions   Fish Allergy Anaphylaxis   Penicillins Anaphylaxis   Latex    Percocet [Oxycodone -Acetaminophen ] Nausea And Vomiting   Plaquenil  [Hydroxychloroquine  Sulfate]     rash   Sulfa Antibiotics     Unknown reaction    Family History  Problem Relation Age of Onset   Multiple sclerosis Mother    Hypertension Father    Allergies Brother    Healthy Brother    Allergic rhinitis Paternal Aunt     Asthma Paternal Aunt    Allergic rhinitis Paternal Grandmother    Asthma Paternal Grandmother    Healthy Daughter    Healthy Son    Colon cancer Neg Hx    Colon polyps Neg Hx    Esophageal cancer Neg Hx    Rectal cancer Neg Hx    Stomach cancer Neg Hx    Social History:  reports that she has never smoked. She has never used smokeless tobacco. She reports that she does not drink alcohol and does not use drugs.  ROS: A complete review of systems was performed.  All systems are negative except for pertinent findings as noted. ROS   Physical Exam:  Vital signs in last 24 hours: BP: ()/()  Arterial Line BP: ()/()  General: NAD Respiratory: normal WOB on RA Cards: RRR per monitor   Laboratory Data:  No results found for this or any previous visit (from the past 24 hours). No results found for this or any previous visit (from the past 240 hours). Creatinine: No results for input(s): CREATININE in the last 168 hours.  Impression/Assessment:  46 F w/ L UPJ stone plan for L URS today.   We discussed risk benefits alternatives to ureteroscopy.  This included bleeding infection and damage to surrounding  structures surrounding structures including ureter as well as urethra.  We discussed the need for stent postoperatively as well as the potential symptoms of stent placement.  We discussed possible inability to complete procedure due to caliber of your ureter or inability to pass stone possibly requiring long-term stent versus nephrostomy tube.  We discussed need for possible second surgery.  Patient voiced their understanding and consent was obtained.   Urine culture showed mixed urogenital flora sent kelfex prior to the procedure.   Plan:  - proceed with L URS w/ LL   Steffan JAYSON Pea 12/19/2023, 6:27 PM

## 2023-12-20 ENCOUNTER — Encounter (HOSPITAL_COMMUNITY)
Admission: RE | Admit: 2023-12-20 | Discharge: 2023-12-20 | Disposition: A | Discharge status: Home / Self Care | Source: Ambulatory Visit | Attending: Urology | Admitting: Urology

## 2023-12-20 ENCOUNTER — Encounter (HOSPITAL_COMMUNITY): Payer: Self-pay

## 2023-12-20 ENCOUNTER — Other Ambulatory Visit: Payer: Self-pay

## 2023-12-20 VITALS — BP 128/81 | HR 65 | Temp 98.3°F | Resp 16 | Ht 64.0 in | Wt 205.0 lb

## 2023-12-20 DIAGNOSIS — N201 Calculus of ureter: Secondary | ICD-10-CM | POA: Diagnosis present

## 2023-12-20 DIAGNOSIS — Z01818 Encounter for other preprocedural examination: Secondary | ICD-10-CM

## 2023-12-20 DIAGNOSIS — Z01812 Encounter for preprocedural laboratory examination: Secondary | ICD-10-CM | POA: Insufficient documentation

## 2023-12-20 DIAGNOSIS — I129 Hypertensive chronic kidney disease with stage 1 through stage 4 chronic kidney disease, or unspecified chronic kidney disease: Secondary | ICD-10-CM | POA: Diagnosis not present

## 2023-12-20 DIAGNOSIS — N189 Chronic kidney disease, unspecified: Secondary | ICD-10-CM | POA: Diagnosis not present

## 2023-12-20 DIAGNOSIS — J45909 Unspecified asthma, uncomplicated: Secondary | ICD-10-CM | POA: Diagnosis not present

## 2023-12-20 DIAGNOSIS — E1122 Type 2 diabetes mellitus with diabetic chronic kidney disease: Secondary | ICD-10-CM | POA: Diagnosis not present

## 2023-12-20 HISTORY — DX: Personal history of urinary calculi: Z87.442

## 2023-12-20 LAB — BASIC METABOLIC PANEL WITH GFR
Anion gap: 8 (ref 5–15)
BUN: 20 mg/dL (ref 6–20)
CO2: 25 mmol/L (ref 22–32)
Calcium: 9.7 mg/dL (ref 8.9–10.3)
Chloride: 105 mmol/L (ref 98–111)
Creatinine, Ser: 0.79 mg/dL (ref 0.44–1.00)
GFR, Estimated: >60 mL/min (ref >60)
Glucose, Bld: 91 mg/dL (ref 70–99)
Potassium: 4 mmol/L (ref 3.5–5.1)
Sodium: 138 mmol/L (ref 135–145)

## 2023-12-20 LAB — CBC
HCT: 35.5 % — ABNORMAL LOW (ref 36.0–46.0)
Hemoglobin: 11.3 g/dL — ABNORMAL LOW (ref 12.0–15.0)
MCH: 28.5 pg (ref 26.0–34.0)
MCHC: 31.8 g/dL (ref 30.0–36.0)
MCV: 89.4 fL (ref 80.0–100.0)
Platelets: 251 K/uL (ref 150–400)
RBC: 3.97 MIL/uL (ref 3.87–5.11)
RDW: 13.7 % (ref 11.5–15.5)
WBC: 3.9 K/uL — ABNORMAL LOW (ref 4.0–10.5)
nRBC: 0 % (ref 0.0–0.2)

## 2023-12-21 ENCOUNTER — Ambulatory Visit (HOSPITAL_COMMUNITY): Admitting: Anesthesiology

## 2023-12-21 ENCOUNTER — Ambulatory Visit (HOSPITAL_COMMUNITY)

## 2023-12-21 ENCOUNTER — Encounter (HOSPITAL_COMMUNITY): Payer: Self-pay | Admitting: Medical

## 2023-12-21 ENCOUNTER — Encounter (HOSPITAL_COMMUNITY): Admission: RE | Disposition: A | Payer: Self-pay | Source: Home / Self Care | Attending: Urology

## 2023-12-21 ENCOUNTER — Ambulatory Visit (HOSPITAL_COMMUNITY)
Admission: RE | Admit: 2023-12-21 | Discharge: 2023-12-21 | Disposition: A | Discharge status: Home / Self Care | Attending: Urology | Admitting: Urology

## 2023-12-21 ENCOUNTER — Encounter (HOSPITAL_COMMUNITY): Payer: Self-pay | Admitting: Urology

## 2023-12-21 DIAGNOSIS — N201 Calculus of ureter: Secondary | ICD-10-CM | POA: Insufficient documentation

## 2023-12-21 DIAGNOSIS — J45909 Unspecified asthma, uncomplicated: Secondary | ICD-10-CM | POA: Insufficient documentation

## 2023-12-21 DIAGNOSIS — E1122 Type 2 diabetes mellitus with diabetic chronic kidney disease: Secondary | ICD-10-CM | POA: Insufficient documentation

## 2023-12-21 DIAGNOSIS — I129 Hypertensive chronic kidney disease with stage 1 through stage 4 chronic kidney disease, or unspecified chronic kidney disease: Secondary | ICD-10-CM | POA: Insufficient documentation

## 2023-12-21 DIAGNOSIS — N189 Chronic kidney disease, unspecified: Secondary | ICD-10-CM | POA: Insufficient documentation

## 2023-12-21 HISTORY — PX: CYSTOSCOPY/URETEROSCOPY/HOLMIUM LASER/STENT PLACEMENT: SHX6546

## 2023-12-21 LAB — GLUCOSE, CAPILLARY
Glucose-Capillary: 88 mg/dL (ref 70–99)
Glucose-Capillary: 119 mg/dL — ABNORMAL HIGH (ref 70–99)

## 2023-12-21 LAB — SURGICAL PCR SCREEN
MRSA, PCR: POSITIVE — AB
Staphylococcus aureus: POSITIVE — AB

## 2023-12-21 SURGERY — CYSTOSCOPY/URETEROSCOPY/HOLMIUM LASER/STENT PLACEMENT
Anesthesia: General | Laterality: Left

## 2023-12-21 MED ORDER — PROPOFOL 10 MG/ML IV BOLUS
INTRAVENOUS | Status: AC
  Filled 2023-12-21: qty 20

## 2023-12-21 MED ORDER — HYOSCYAMINE SULFATE 0.125 MG PO TBDP: 0.1250 mg | ORAL_TABLET | Freq: Four times a day (QID) | ORAL | 0 refills | Status: AC | PRN

## 2023-12-21 MED ORDER — PHENAZOPYRIDINE HCL 200 MG PO TABS: 200.0000 mg | ORAL_TABLET | Freq: Three times a day (TID) | ORAL | 0 refills | Status: AC | PRN

## 2023-12-21 MED ORDER — LIDOCAINE HCL (CARDIAC) PF 100 MG/5ML IV SOSY
PREFILLED_SYRINGE | INTRAVENOUS | Status: DC | PRN
  Administered 2023-12-21: 80 mg via INTRAVENOUS

## 2023-12-21 MED ORDER — PROPOFOL 1000 MG/100ML IV EMUL
INTRAVENOUS | Status: AC
  Filled 2023-12-21: qty 100

## 2023-12-21 MED ORDER — KETOROLAC TROMETHAMINE 30 MG/ML IJ SOLN
INTRAMUSCULAR | Status: AC
  Filled 2023-12-21: qty 1

## 2023-12-21 MED ORDER — KETOROLAC TROMETHAMINE 30 MG/ML IJ SOLN
30.0000 mg | Freq: Once | INTRAMUSCULAR | Status: AC | PRN
  Administered 2023-12-21: 30 mg via INTRAVENOUS

## 2023-12-21 MED ORDER — PROPOFOL 10 MG/ML IV BOLUS
INTRAVENOUS | Status: DC | PRN
Start: 2023-12-21 — End: 2023-12-21
  Administered 2023-12-21: 150 mg via INTRAVENOUS
  Administered 2023-12-21: 125 ug/kg/min via INTRAVENOUS

## 2023-12-21 MED ORDER — FENTANYL CITRATE (PF) 100 MCG/2ML IJ SOLN
INTRAMUSCULAR | Status: AC
  Filled 2023-12-21: qty 2

## 2023-12-21 MED ORDER — ONDANSETRON HCL 4 MG/2ML IJ SOLN
INTRAMUSCULAR | Status: AC
  Filled 2023-12-21: qty 2

## 2023-12-21 MED ORDER — ONDANSETRON HCL 4 MG/2ML IJ SOLN
INTRAMUSCULAR | Status: DC | PRN
  Administered 2023-12-21: 4 mg via INTRAVENOUS

## 2023-12-21 MED ORDER — TRAMADOL HCL 50 MG PO TABS: 50.0000 mg | ORAL_TABLET | Freq: Four times a day (QID) | ORAL | 0 refills | Status: AC | PRN

## 2023-12-21 MED ORDER — HYDROCODONE-ACETAMINOPHEN 7.5-325 MG PO TABS
ORAL_TABLET | ORAL | Status: AC
  Filled 2023-12-21: qty 1

## 2023-12-21 MED ORDER — SODIUM CHLORIDE 0.9 % IR SOLN
Status: DC | PRN
  Administered 2023-12-21: 3000 mL via INTRAVESICAL

## 2023-12-21 MED ORDER — CIPROFLOXACIN IN D5W 400 MG/200ML IV SOLN
400.0000 mg | Freq: Two times a day (BID) | INTRAVENOUS | Status: DC
  Administered 2023-12-21: 400 mg via INTRAVENOUS
  Filled 2023-12-21: qty 200

## 2023-12-21 MED ORDER — ORAL CARE MOUTH RINSE: 15.0000 mL | Freq: Once | OROMUCOSAL | Status: AC

## 2023-12-21 MED ORDER — HYDROCODONE-ACETAMINOPHEN 7.5-325 MG PO TABS
1.0000 | ORAL_TABLET | Freq: Once | ORAL | Status: AC | PRN
  Administered 2023-12-21: 1 via ORAL

## 2023-12-21 MED ORDER — FENTANYL CITRATE (PF) 50 MCG/ML IJ SOSY
PREFILLED_SYRINGE | INTRAMUSCULAR | Status: AC
  Filled 2023-12-21: qty 1

## 2023-12-21 MED ORDER — MIDAZOLAM HCL 2 MG/2ML IJ SOLN
INTRAMUSCULAR | Status: AC
  Filled 2023-12-21: qty 2

## 2023-12-21 MED ORDER — IOHEXOL 300 MG/ML  SOLN
INTRAMUSCULAR | Status: DC | PRN
  Administered 2023-12-21: 7 mL via URETHRAL

## 2023-12-21 MED ORDER — ACETAMINOPHEN 500 MG PO TABS
1000.0000 mg | ORAL_TABLET | Freq: Once | ORAL | Status: AC
  Administered 2023-12-21: 1000 mg via ORAL

## 2023-12-21 MED ORDER — ALFUZOSIN HCL ER 10 MG PO TB24: 10.0000 mg | ORAL_TABLET | Freq: Every day | ORAL | 0 refills | Status: AC

## 2023-12-21 MED ORDER — CHLORHEXIDINE GLUCONATE 0.12 % MT SOLN
15.0000 mL | Freq: Once | OROMUCOSAL | Status: AC
  Administered 2023-12-21: 15 mL via OROMUCOSAL

## 2023-12-21 MED ORDER — EPHEDRINE 5 MG/ML INJ
INTRAVENOUS | Status: AC
  Filled 2023-12-21: qty 5

## 2023-12-21 MED ORDER — AMISULPRIDE (ANTIEMETIC) 5 MG/2ML IV SOLN: 10.0000 mg | Freq: Once | INTRAVENOUS | Status: DC | PRN

## 2023-12-21 MED ORDER — ACETAMINOPHEN 500 MG PO TABS
ORAL_TABLET | ORAL | Status: AC
  Filled 2023-12-21: qty 2

## 2023-12-21 MED ORDER — LIDOCAINE HCL (PF) 2 % IJ SOLN
INTRAMUSCULAR | Status: AC
  Filled 2023-12-21: qty 5

## 2023-12-21 MED ORDER — 0.9 % SODIUM CHLORIDE (POUR BTL) OPTIME
TOPICAL | Status: DC | PRN
  Administered 2023-12-21: 1000 mL

## 2023-12-21 MED ORDER — PHENYLEPHRINE 80 MCG/ML (10ML) SYRINGE FOR IV PUSH (FOR BLOOD PRESSURE SUPPORT)
PREFILLED_SYRINGE | INTRAVENOUS | Status: AC
  Filled 2023-12-21: qty 20

## 2023-12-21 MED ORDER — EPHEDRINE SULFATE (PRESSORS) 25 MG/5ML IV SOSY
PREFILLED_SYRINGE | INTRAVENOUS | Status: DC | PRN
  Administered 2023-12-21: 10 mg via INTRAVENOUS
  Administered 2023-12-21: 5 mg via INTRAVENOUS
  Administered 2023-12-21: 10 mg via INTRAVENOUS

## 2023-12-21 MED ORDER — FENTANYL CITRATE (PF) 50 MCG/ML IJ SOSY
50.0000 ug | PREFILLED_SYRINGE | INTRAMUSCULAR | Status: DC | PRN
  Administered 2023-12-21: 50 ug via INTRAVENOUS

## 2023-12-21 MED ORDER — PHENYLEPHRINE 80 MCG/ML (10ML) SYRINGE FOR IV PUSH (FOR BLOOD PRESSURE SUPPORT)
PREFILLED_SYRINGE | INTRAVENOUS | Status: AC
  Filled 2023-12-21: qty 10

## 2023-12-21 MED ORDER — MEPERIDINE HCL 100 MG/ML IJ SOLN: 6.2500 mg | INTRAMUSCULAR | Status: DC | PRN

## 2023-12-21 MED ORDER — ONDANSETRON HCL 4 MG/2ML IJ SOLN
4.0000 mg | Freq: Once | INTRAMUSCULAR | Status: AC | PRN
  Administered 2023-12-21: 4 mg via INTRAVENOUS

## 2023-12-21 MED ORDER — METHOCARBAMOL 750 MG PO TABS: 750.0000 mg | ORAL_TABLET | Freq: Four times a day (QID) | ORAL | 0 refills | Status: AC

## 2023-12-21 MED ORDER — HYDROMORPHONE HCL 1 MG/ML IJ SOLN: 0.2500 mg | INTRAMUSCULAR | Status: DC | PRN

## 2023-12-21 MED ORDER — FENTANYL CITRATE (PF) 100 MCG/2ML IJ SOLN
INTRAMUSCULAR | Status: DC | PRN
  Administered 2023-12-21: 75 ug via INTRAVENOUS
  Administered 2023-12-21 (×3): 25 ug via INTRAVENOUS

## 2023-12-21 MED ORDER — PHENYLEPHRINE HCL (PRESSORS) 10 MG/ML IV SOLN
INTRAVENOUS | Status: DC | PRN
  Administered 2023-12-21 (×5): 160 ug via INTRAVENOUS

## 2023-12-21 MED ORDER — LACTATED RINGERS IV SOLN: INTRAVENOUS | Status: DC

## 2023-12-21 MED ORDER — DEXAMETHASONE SODIUM PHOSPHATE 4 MG/ML IJ SOLN
INTRAMUSCULAR | Status: DC | PRN
  Administered 2023-12-21: 6 mg via INTRAVENOUS

## 2023-12-21 MED ORDER — MIDAZOLAM HCL 5 MG/5ML IJ SOLN
INTRAMUSCULAR | Status: DC | PRN
  Administered 2023-12-21: 2 mg via INTRAVENOUS

## 2023-12-21 SURGICAL SUPPLY — 20 items
BAG URO CATCHER STRL LF (MISCELLANEOUS) ×2 IMPLANT
BASKET ZERO TIP NITINOL 2.4FR (BASKET) IMPLANT
CATH URETERAL DUAL LUMEN 10F (MISCELLANEOUS) IMPLANT
CATH URETL OPEN 5X70 (CATHETERS) IMPLANT
CLOTH BEACON ORANGE TIMEOUT ST (SAFETY) ×2 IMPLANT
EXTRACTOR STONE 1.7FRX115CM (UROLOGICAL SUPPLIES) IMPLANT
FIBER LASER MOSES 200 DFL (Laser) IMPLANT
FIBER LASER MOSES 365 DFL (Laser) IMPLANT
GLOVE BIO SURGEON STRL SZ8 (GLOVE) ×2 IMPLANT
GOWN STRL REUS W/ TWL XL LVL3 (GOWN DISPOSABLE) ×2 IMPLANT
GUIDEWIRE ANG ZIPWIRE 038X150 (WIRE) IMPLANT
GUIDEWIRE STR DUAL SENSOR (WIRE) ×2 IMPLANT
KIT TURNOVER KIT A (KITS) ×2 IMPLANT
MANIFOLD NEPTUNE II (INSTRUMENTS) ×2 IMPLANT
PACK CYSTO (CUSTOM PROCEDURE TRAY) ×2 IMPLANT
SHEATH NAVIGATOR HD 11/13X28 (SHEATH) IMPLANT
SHEATH NAVIGATOR HD 11/13X36 (SHEATH) ×2 IMPLANT
STENT URET 6FRX24 CONTOUR (STENTS) IMPLANT
TUBING CONNECTING 10 (TUBING) ×2 IMPLANT
TUBING UROLOGY SET (TUBING) ×2 IMPLANT

## 2023-12-21 NOTE — Transfer of Care (Signed)
 Immediate Anesthesia Transfer of Care Note  Patient: Misty Barnes  Procedure(s) Performed: Procedure(s): CYSTOSCOPY/URETEROSCOPY/HOLMIUM LASER/STENT PLACEMENT (Left)  Patient Location: PACU  Anesthesia Type:General  Level of Consciousness:  sedated, patient cooperative and responds to stimulation  Airway & Oxygen Therapy:Patient Spontanous Breathing and Patient connected to face mask oxgen  Post-op Assessment:  Report given to PACU RN and Post -op Vital signs reviewed and stable  Post vital signs:  Reviewed and stable  Last Vitals:  Vitals:   12/21/23 0938 12/21/23 1237  BP: (!) 91/58 99/73  Pulse: 66 84  Resp: 14 15  Temp: 36.7 C (!) 36.3 C  SpO2: 100% 100%    Complications: No apparent anesthesia complications

## 2023-12-21 NOTE — Anesthesia Postprocedure Evaluation (Signed)
 Anesthesia Post Note  Patient: Misty Barnes  Procedure(s) Performed: CYSTOSCOPY/URETEROSCOPY/HOLMIUM LASER/STENT PLACEMENT (Left)     Patient location during evaluation: PACU Anesthesia Type: General Level of consciousness: awake and alert, oriented and patient cooperative Pain management: pain level controlled Vital Signs Assessment: post-procedure vital signs reviewed and stable Respiratory status: spontaneous breathing, nonlabored ventilation and respiratory function stable Cardiovascular status: blood pressure returned to baseline and stable Postop Assessment: no apparent nausea or vomiting Anesthetic complications: no   No notable events documented.  Last Vitals:  Vitals:   12/21/23 1300 12/21/23 1315  BP: 92/71 100/64  Pulse: 77 (!) 58  Resp: 15 15  Temp:    SpO2: 96% 93%    Last Pain:  Vitals:   12/21/23 1315  TempSrc:   PainSc: Asleep                 Almarie CHRISTELLA Marchi

## 2023-12-21 NOTE — Anesthesia Procedure Notes (Signed)
 Procedure Name: LMA Insertion Date/Time: 12/21/2023 11:54 AM  Performed by: Vincenzo Show, CRNAPre-anesthesia Checklist: Patient identified, Emergency Drugs available, Suction available, Patient being monitored and Timeout performed Patient Re-evaluated:Patient Re-evaluated prior to induction Oxygen Delivery Method: Circle system utilized Preoxygenation: Pre-oxygenation with 100% oxygen Induction Type: IV induction Ventilation: Mask ventilation without difficulty LMA: LMA inserted LMA Size: 4.0 Tube size: 4.0 mm Number of attempts: 1 Placement Confirmation: positive ETCO2 and breath sounds checked- equal and bilateral Tube secured with: Tape Dental Injury: Teeth and Oropharynx as per pre-operative assessment  Comments: AT LMA, easy pass.

## 2023-12-21 NOTE — Anesthesia Preprocedure Evaluation (Addendum)
 Anesthesia Evaluation  Patient identified by MRN, date of birth, ID band Patient awake    Reviewed: Allergy & Precautions, H&P , NPO status , Patient's Chart, lab work & pertinent test results  History of Anesthesia Complications (+) PONV and history of anesthetic complications  Airway Mallampati: III  TM Distance: >3 FB Neck ROM: Full    Dental  (+) Teeth Intact, Dental Advisory Given   Pulmonary asthma  Snores at night, no sleep study Albuterol  and albuterol -budesonide  last used yesterday  Rescue inhaler use 1-2x/mo   Pulmonary exam normal breath sounds clear to auscultation       Cardiovascular hypertension, Normal cardiovascular exam Rhythm:Regular Rate:Normal     Neuro/Psych  Headaches  negative psych ROS   GI/Hepatic negative GI ROS, Neg liver ROS,,,  Endo/Other  diabetes, Well Controlled, Type 2    Renal/GU negative Renal ROS  negative genitourinary   Musculoskeletal negative musculoskeletal ROS (+)    Abdominal  (+) + obese  Peds negative pediatric ROS (+)  Hematology negative hematology ROS (+)   Anesthesia Other Findings Wegovy  LD: 2 weeks  Reproductive/Obstetrics negative OB ROS                              Anesthesia Physical Anesthesia Plan  ASA: 3  Anesthesia Plan: General   Post-op Pain Management: Tylenol  PO (pre-op)* and Toradol  IV (intra-op)*   Induction: Intravenous  PONV Risk Score and Plan: 4 or greater and Ondansetron , Dexamethasone , Midazolam , Treatment may vary due to age or medical condition, Propofol  infusion and TIVA  Airway Management Planned: LMA  Additional Equipment: None  Intra-op Plan:   Post-operative Plan: Extubation in OR  Informed Consent: I have reviewed the patients History and Physical, chart, labs and discussed the procedure including the risks, benefits and alternatives for the proposed anesthesia with the patient or authorized  representative who has indicated his/her understanding and acceptance.     Dental advisory given  Plan Discussed with: CRNA  Anesthesia Plan Comments: (Significant pain in preop- will treat w/ tylenol  while awaiting OR )         Anesthesia Quick Evaluation

## 2023-12-21 NOTE — Discharge Instructions (Signed)
 DISCHARGE INSTRUCTIONS FOR Ureteroscopy and/or Ureteral Stent Placement  MEDICATIONS:  1.  Robaxin  2. Tamsulosin   3. Hyoscyamine    ACTIVITY:  1. No strenuous activity x 1week  2. No driving while on narcotic pain medications  3. Drink plenty of water   4. Continue to walk at home - it is normal to see blood in the urine while the stent is in place, so keep active, but don't over do it.  5. May return to work/school tomorrow or when you feel ready  6. You may experience some pain when urinating in the kidney on the side that was operated on while the stent is in place this is normal  WHAT IS NORMAL TO EXPERIENCE: It is normal to feel the urge to urinate while the stent is in place It is normal to have blood in your urine while the stent is in place  It sometimes can be normal to have pain in your kidney when you urinate   BATHING:  1. You can shower and we recommend daily showers    DIET: You may return to your normal diet immediately. Because of the raw surface of your bladder, alcohol, spicy foods, acid type foods and drinks with caffeine may cause irritation or frequency and should be used in moderation. To keep your urine flowing freely and to avoid constipation, drink plenty of fluids during the day ( 8-10 glasses ). Tip: Avoid cranberry juice because it is very acidic.  SIGNS/SYMPTOMS TO CALL:  Please call us  if you have a fever greater than 101.5, uncontrolled nausea/vomiting, uncontrolled pain, dizziness, unable to urinate, bloody urine with clots greater than the size of a quarter, chest pain, shortness of breath, leg swelling, leg pain, redness around wound, drainage from wound, or any other concerns or questions.   You can reach us  at [phone].   FOLLOW-UP:  1. You you have been set up for f/u in 6-8 weeks  2. You will have your stent removed in clinic in 2 weeks

## 2023-12-21 NOTE — Progress Notes (Addendum)
 PCR results faxed to Dr. Shane via [facility]. Made Anneita M.RN in short stay aware of PCR results.

## 2023-12-21 NOTE — Op Note (Signed)
 Preoperative diagnosis: left ureteral calculus  Postoperative diagnosis: left ureteral calculus  Procedure:  Cystoscopy left ureteroscopy and stone removal Ureteroscopic laser lithotripsy left 48F x 24 ureteral stent placement  left retrograde pyelography with interpretation  Surgeon: Steffan Pea MD.  Anesthesia: General  Complications: None  Intraoperative findings:  - Majority of stone dusted, small stone in lower pole unable to be accessed sue to angle, unlikely to cause issues in future - all stone fragments dusted to < 2mm - ureteral injury seen in proximal ureter   Retrograde pyelogram interpretation: - no filling defect - no contrast extravisation - decompressed renal pelvis   EBL: Minimal  Specimens: left ureteral calculus  Disposition of specimens: [facility] for stone analysis  Indication: Misty Barnes is a 53 y.o.   patient with a L UPJ stone   and associated left symptoms. After reviewing the management options for treatment, the patient elected to proceed with the above surgical procedure(s). We have discussed the potential benefits and risks of the procedure, side effects of the proposed treatment, the likelihood of the patient achieving the goals of the procedure, and any potential problems that might occur during the procedure or recuperation. Informed consent has been obtained.   Description of procedure:  The patient was taken to the operating room and general anesthesia was induced.  The patient was placed in the dorsal lithotomy position, prepped and draped in the usual sterile fashion, and preoperative antibiotics were administered. A preoperative time-out was performed.   Cystourethroscopy was performed.  The patient's urethra was examined and was normal. . Pan cystoscopy was then performed. There was no evidence for any bladder tumors, stones, or other mucosal pathology.    Attention then turned to the left ureteral orifice  and a ureteral catheter was used to intubate the ureteral orifice.   A 0.38 sensor guidewire was then advanced up the left ureter into the renal pelvis under fluoroscopic guidance. A dual lumen catheter was then placed over the sensor wire into the ureter. A second sensor wire was then inserted in to the collecting system. Placement was confirmed by fluoro. The dual lumen catheter was then removed and a ureteral access sheath size 35cm  11-28fr was inserted over the sensor wire under fluoro to confirm proper placement at the ureteropelvic junction. The obturator and sensor wire were then removed, leaving the other wire on the outside of the sheath as a safety wire.   A flexible ureteroscopy was then advanced through the sheath and into the collecting system. Pan pyeloscopy was then performed.    The stone was then fragmented with a 200 micron holmium laser fiber on a setting of 0.5 J and frequency of 30 Hz.  All stones were fragmented to sizes less than 2mm in diameter. Except there was some small stone unable to be accessed in lower pole   Pan pyeloscopy was then performed and no stones greater than 2mm were visualized.   The sheath was then removed leaving the safety wire in place. Visualization of the ureter on withdrawal of the sheath showed a proximal ureteral injury form the sheath placement.   The safety wire was then back loaded through the 29fr cystoscope. The cystoscope was then driven into the bladder and a 6x4fr without strings was placed without complication. Proper placement was confirmed with flouro.   The bladder was then emptied and the procedure ended.  The patient appeared to tolerate the procedure well and without complications.  The patient was able  to be awakened and transferred to the recovery unit in satisfactory condition.   Disposition: The tether of the stent was reemoved. The patient has been scheduled for followup in 6 weeks with a renal ultrasound.  The patient will be  scheduled for stent removal in 2-3 weeks in our clinic.

## 2023-12-22 ENCOUNTER — Encounter (HOSPITAL_COMMUNITY): Payer: Self-pay | Admitting: Urology
